# Patient Record
Sex: Male | Born: 1945
Health system: Southern US, Community
[De-identification: ages and names within clinical notes are randomized; demographics above are authoritative.]

## PROBLEM LIST (undated history)

## (undated) DIAGNOSIS — I509 Heart failure, unspecified: Secondary | ICD-10-CM

## (undated) DIAGNOSIS — E7800 Pure hypercholesterolemia, unspecified: Secondary | ICD-10-CM

## (undated) DIAGNOSIS — I10 Essential (primary) hypertension: Secondary | ICD-10-CM

## (undated) DIAGNOSIS — J449 Chronic obstructive pulmonary disease, unspecified: Secondary | ICD-10-CM

## (undated) HISTORY — PX: KNEE ARTHROSCOPY: SHX127

---

## 2004-03-19 ENCOUNTER — Emergency Department (HOSPITAL_COMMUNITY): Admission: EM | Admit: 2004-03-19 | Discharge: 2004-03-20 | Payer: Self-pay | Admitting: Emergency Medicine

## 2004-04-13 ENCOUNTER — Ambulatory Visit (HOSPITAL_COMMUNITY): Admission: RE | Admit: 2004-04-13 | Discharge: 2004-04-13 | Payer: Self-pay | Admitting: Orthopaedic Surgery

## 2004-08-25 ENCOUNTER — Encounter (INDEPENDENT_AMBULATORY_CARE_PROVIDER_SITE_OTHER): Payer: Self-pay | Admitting: Orthopaedic Surgery

## 2004-08-25 ENCOUNTER — Ambulatory Visit (HOSPITAL_COMMUNITY): Admission: RE | Admit: 2004-08-25 | Discharge: 2004-08-25 | Payer: Self-pay | Admitting: Orthopaedic Surgery

## 2008-12-27 ENCOUNTER — Emergency Department (HOSPITAL_COMMUNITY): Admission: EM | Admit: 2008-12-27 | Discharge: 2008-12-27 | Payer: Self-pay | Admitting: Emergency Medicine

## 2010-06-02 LAB — DIFFERENTIAL
Eosinophils Absolute: 0 10*3/uL (ref 0.0–0.7)
Eosinophils Relative: 0 % (ref 0–5)
Lymphocytes Relative: 20 % (ref 12–46)
Lymphs Abs: 1.8 10*3/uL (ref 0.7–4.0)
Monocytes Absolute: 0.9 10*3/uL (ref 0.1–1.0)

## 2010-06-02 LAB — CBC
HCT: 43.1 % (ref 39.0–52.0)
Hemoglobin: 15.3 g/dL (ref 13.0–17.0)
MCV: 99.8 fL (ref 78.0–100.0)
Platelets: 278 10*3/uL (ref 150–400)
RDW: 12.7 % (ref 11.5–15.5)
WBC: 8.8 10*3/uL (ref 4.0–10.5)

## 2010-06-02 LAB — POCT CARDIAC MARKERS: Troponin i, poc: <0.05 ng/mL (ref 0.00–0.09)

## 2010-06-02 LAB — BASIC METABOLIC PANEL
BUN: 8 mg/dL (ref 6–23)
Chloride: 99 mEq/L (ref 96–112)
Glucose, Bld: 81 mg/dL (ref 70–99)
Potassium: 4.6 mEq/L (ref 3.5–5.1)
Sodium: 136 mEq/L (ref 135–145)

## 2010-07-15 NOTE — Op Note (Signed)
Eric Diaz, [name]              ACCOUNT NO.:  [id number]   MEDICAL RECORD NO.:  [id number]          PATIENT TYPE:  AMB   LOCATION:  DAY                           FACILITY:  [facility]   PHYSICIAN:  J. Darreld Mclean, M.D. DATE OF BIRTH:  1945/03/22   DATE OF PROCEDURE:  08/25/2004  DATE OF DISCHARGE:                                 OPERATIVE REPORT   PREOPERATIVE DIAGNOSIS:  Tear of medial meniscus, left  knee.   POSTOPERATIVE DIAGNOSIS:  Tear of medial meniscus, left  knee.   PROCEDURE:  1. Operative arthroscopy, left  knee.  2. Partial medial meniscectomy using the laser.   ANESTHESIA:  General.   TOURNIQUET TIME:  25 minutes.   SURGEON:  J. Darreld Mclean, MD   ASSISTANT:  Candace Cruise, PA-C   INDICATIONS:  The patient is a 65 year old male with pain and tenderness in  his left  knee.  This has been bothering him for several months.  He had  fallen from a roof in January and hurt his left  knee.  MRI done April 13, 2004, showed a tear in the posterior horn and body of the medial  meniscus.  At that time, he did not want any surgery, but he has continued  to have pain and tenderness and a giving away and desires to have surgery  now.  Risks and imponderables were discussed preoperatively.   OPERATIVE FINDINGS:  Suprapatellar pouch looked normal.  Medially, there was  a tear in the posterior horn of the medial meniscus.  The articular surfaces  looked good with just some minimal grade 2 changes.  The anterior cruciate  was intact.  Laterally, the meniscus and the articular surfaces looked  normal.  There were no loose bodies.   DESCRIPTION OF PROCEDURE:  The patient was seen in the holding area and  identified the left  knee as the correct surgical site.  He placed a mark on  it.  I placed a mark over the left patella.  The patient was brought back to  the operating room and given general anesthesia while supine.  The  tourniquet and leg holder were placed beneath to the  left upper thigh.  The  patient was prepped and draped in the usual manner.  We had a timeout, and  we again identified Eric Diaz and that we were doing a left  knee  arthroscopy.  The leg was elevated and wrapped circumferentially with an  Esmarch bandage.  The tourniquet was inflated to 250 mmHg.  The Esmarch  bandage was removed.  The inflow cannula was inserted medially.  The  lactated Ringer's was inserted in the knee by an effusion pump.  The  arthroscope was inserted laterally and the knee systemically examined.  Please see findings above.  Use of the probe from the medial portal showed  the tear in the posterior horn.  Good __________ pictures were taken.  Then  I used the laser and used the laser at a setting of initially 2.4 joules and  increased from 2.6 joules with a 30 pulse, 78 watts.  I  was able to trim the  meniscus and remove the large fragment.  This was removed, and I then  smoothed down the meniscus and the posterior horn.  We got a good smooth  contour.  A total of 28.12 joules were used during the procedure.  A sucker  shaver was used with the ends removed just to suction out any debris.  The  knee was systematically examined and no new pathology found.  The wound was  irrigated with the remaining part of lactated Ringer's.  The wound was  reapproximated using 3-0 nylon in an interrupted vertical mattress manner.  Marcaine 0.25% was instilled into each portal.  The tourniquet was deflated  after 25 minutes.  A sterile dressing was applied.  A bulky dressing was  applied.  The knee immobilizer was applied.  He was sent to recovery in good  condition.   A prescription for Vicodin ES was given for pain.  I will see him in the  office in approximately 10 days.  If he has any difficulty, he can contact  me through the office or hospital beeper system.       JWK/MEDQ  D:  08/25/2004  T:  08/25/2004  Job:  [id number]

## 2010-07-15 NOTE — H&P (Signed)
Eric Diaz, MOZER              ACCOUNT NO.:  [id number]   MEDICAL RECORD NO.:  [id number]          PATIENT TYPE:  AMB   LOCATION:  DAY                           FACILITY:  [facility]   PHYSICIAN:  J. Darreld Mclean, M.D. DATE OF BIRTH:  08-04-1945   DATE OF ADMISSION:  DATE OF DISCHARGE:  LH                                HISTORY & PHYSICAL   CHIEF COMPLAINT:  My knee hurts.   The patient is a 65 year old male with pain and tenderness in his left knee  for several months. He fell from a roof on January 21 and hurt his left  knee. Continued to have pain. Continued to have tenderness in the knee. He  was seen in the office on February 10 for the first time. I thought he had a  medial meniscal injury, and I recommended a MRI. MRI was done on February 15  which showed tear of the posterior horn and body of the medial meniscus and  partial tear of the medial collateral ligament. The patient was advised of  the findings on February 20. He preferred not to have any surgery at that  time. His medial collateral ligament improved, but he continued to have some  pain and tenderness in the knee and some giving way. He wanted to have the  surgery done at the end of June and called the office on June 1 and set this  up. Risks and imponderables of the procedure have been discussed.   PAST MEDICAL HISTORY:  Past history is negative for heart disease, lung  disease, kidney disease, stroke, paralysis, weakness, hypertension,  diabetes, rheumatic fever, cancer, polio, ulcer disease, circulatory  problems.   ALLERGIES:  He has no allergies.   MEDICATIONS:  He has taken Vicodin 7.5 and Naprosyn 500 mg.   SOCIAL HISTORY:  He smokes a pack a day. He uses alcoholic beverages  socially. He is a Engineer, agricultural. He does not have a family doctor. He  lives in [place], and he is married.   PHYSICAL EXAMINATION:  VITAL SIGNS:  His vital signs are within normal  limits.  HEENT:  Negative.  NECK:   Supple.  LUNGS:  Clear to P&A.  HEART:  Regular rhythm without murmur heard.  ABDOMEN:  Soft without masses.  EXTREMITIES:  Left knee has some slight effusion, pain and tenderness  medially. Range of motion of the left knee is full but painful. There is  slight limp to the left with walking and slight atrophy of the left high.  Other extremities within normal limits.  CENTRAL NERVOUS SYSTEM:  Intact.  SKIN:  Intact.   IMPRESSION:  Tear, medial meniscus, left knee.   PLAN:  Arthroscopy of the left knee as an outpatient. We discussed the  procedure, plan, and risk and imponderables. He appears to understand and  agrees to the procedure as outlined. Labs are pending.       JWK/MEDQ  D:  08/23/2004  T:  08/23/2004  Job:  [id number]

## 2010-10-10 ENCOUNTER — Encounter: Payer: Self-pay | Admitting: *Deleted

## 2010-10-10 ENCOUNTER — Emergency Department (HOSPITAL_COMMUNITY)
Admission: EM | Admit: 2010-10-10 | Discharge: 2010-10-10 | Disposition: A | Discharge status: Home / Self Care | Payer: Managed Care, Other (non HMO) | Attending: Emergency Medicine | Admitting: Emergency Medicine

## 2010-10-10 DIAGNOSIS — F17200 Nicotine dependence, unspecified, uncomplicated: Secondary | ICD-10-CM | POA: Insufficient documentation

## 2010-10-10 DIAGNOSIS — R21 Rash and other nonspecific skin eruption: Secondary | ICD-10-CM | POA: Insufficient documentation

## 2010-10-10 MED ORDER — PREDNISONE 10 MG PO TABS: ORAL_TABLET | ORAL | Status: DC

## 2010-10-10 MED ORDER — METHYLPREDNISOLONE SODIUM SUCC 125 MG IJ SOLR
125.0000 mg | Freq: Once | INTRAMUSCULAR | Status: DC
  Filled 2010-10-10: qty 2

## 2010-10-10 MED ORDER — METHYLPREDNISOLONE SODIUM SUCC 125 MG IJ SOLR
125.0000 mg | Freq: Once | INTRAMUSCULAR | Status: AC
  Administered 2010-10-10: 125 mg via INTRAMUSCULAR

## 2010-10-10 MED ORDER — FEXOFENADINE HCL 180 MG PO TABS: 180.0000 mg | ORAL_TABLET | Freq: Every day | ORAL | Status: DC

## 2010-10-10 NOTE — ED Provider Notes (Signed)
History     CSN: [id number] Arrival date & time: 10/10/2010  2:21 PM  Chief Complaint  Patient presents with  . Rash   Patient is a 65 y.o. male presenting with rash. The history is provided by the patient and the spouse.  Rash  This is a new problem. The current episode started more than 2 days ago. The problem has been gradually worsening. Associated with: recent cold/OTC cough and cold meds. The rash is present on the neck, torso, back, abdomen, left hand and right hand. The patient is experiencing no pain. Pertinent negatives include no blisters, no pain and no weeping. He has tried antihistamines for the symptoms.    History reviewed. No pertinent past medical history.  Past Surgical History  Procedure Date  . Knee arthroscopy     left    History reviewed. No pertinent family history.  History  Substance Use Topics  . Smoking status: Current Everyday Smoker -- 1.0 packs/day  . Smokeless tobacco: Not on file  . Alcohol Use: Yes     occasionally      Review of Systems  Constitutional: Negative for activity change.       All ROS Neg except as noted in HPI  HENT: Negative for nosebleeds and neck pain.   Eyes: Negative for photophobia and discharge.  Respiratory: Negative for cough, shortness of breath and wheezing.   Cardiovascular: Negative for chest pain and palpitations.  Gastrointestinal: Negative for abdominal pain and blood in stool.  Genitourinary: Negative for dysuria, frequency and hematuria.  Musculoskeletal: Negative for back pain and arthralgias.  Skin: Positive for rash.  Neurological: Negative for dizziness, seizures and speech difficulty.  Psychiatric/Behavioral: Negative for hallucinations and confusion.    Physical Exam  BP 147/93  Pulse 90  Temp(Src) 97.7 F (36.5 C) (Oral)  Resp 20  Ht 6' (1.829 m)  Wt 175 lb (79.379 kg)  BMI 23.73 kg/m2  SpO2 100%  Physical Exam  Nursing note and vitals reviewed. Constitutional: He is oriented to  person, place, and time. He appears well-developed and well-nourished.  Non-toxic appearance.  HENT:  Head: Normocephalic.  Right Ear: Tympanic membrane and external ear normal.  Left Ear: Tympanic membrane and external ear normal.       Airway patent.  Eyes: EOM and lids are normal. Pupils are equal, round, and reactive to light.  Neck: Normal range of motion. Neck supple. Carotid bruit is not present.       Trachea midline.  Cardiovascular: Normal rate, regular rhythm, normal heart sounds, intact distal pulses and normal pulses.   Pulmonary/Chest: Breath sounds normal. No respiratory distress.       No wheezes, rales or congestion.  Abdominal: Soft. Bowel sounds are normal. There is no tenderness. There is no guarding.  Musculoskeletal: Normal range of motion.  Lymphadenopathy:       Head (right side): No submandibular adenopathy present.       Head (left side): No submandibular adenopathy present.    He has no cervical adenopathy.  Neurological: He is alert and oriented to person, place, and time. He has normal strength. No cranial nerve deficit or sensory deficit.  Skin: Skin is warm and dry. Rash noted.       Raised papular rash on the abd, back, neck, chest and both arms. No blister. No hives. No mouth involvement.  Psychiatric: He has a normal mood and affect. His speech is normal.    ED Course  Procedures  MDM I have reviewed  nursing notes, vital signs, and all appropriate lab and imaging results for this patient. Suspect OTC drug reaction vs VIRal exanthem.      Kathie Dike, PA 10/10/10 1516  Kathie Dike, PA 10/10/10 [phone]

## 2010-10-10 NOTE — ED Notes (Signed)
Rash all over body since Thursday. Pt has had a cold and cough and has been taking OTC meds. Pt states that he is coughing up yellow phlegm. Denies fever.

## 2010-10-10 NOTE — ED Notes (Signed)
Rash over arms, back and torso

## 2010-11-10 NOTE — ED Provider Notes (Signed)
Medical screening examination/treatment/procedure(s) were performed by non-physician practitioner and as supervising physician I was immediately available for consultation/collaboration.  Lailah Marcelli P Vinh Sachs, MD 11/10/10 1730 

## 2018-03-05 ENCOUNTER — Encounter (HOSPITAL_COMMUNITY): Payer: Self-pay | Admitting: Emergency Medicine

## 2018-03-05 ENCOUNTER — Inpatient Hospital Stay (HOSPITAL_COMMUNITY)
Admission: EM | Admit: 2018-03-05 | Discharge: 2018-03-07 | DRG: 242 | Disposition: A | Discharge status: Home / Self Care | Payer: Medicare HMO | Attending: Family Medicine | Admitting: Family Medicine

## 2018-03-05 ENCOUNTER — Other Ambulatory Visit: Payer: Self-pay

## 2018-03-05 ENCOUNTER — Emergency Department (HOSPITAL_COMMUNITY): Payer: Medicare HMO

## 2018-03-05 DIAGNOSIS — I442 Atrioventricular block, complete: Secondary | ICD-10-CM | POA: Diagnosis present

## 2018-03-05 DIAGNOSIS — I509 Heart failure, unspecified: Secondary | ICD-10-CM | POA: Diagnosis present

## 2018-03-05 DIAGNOSIS — R0602 Shortness of breath: Secondary | ICD-10-CM

## 2018-03-05 DIAGNOSIS — I452 Bifascicular block: Secondary | ICD-10-CM | POA: Diagnosis present

## 2018-03-05 DIAGNOSIS — J189 Pneumonia, unspecified organism: Secondary | ICD-10-CM | POA: Diagnosis present

## 2018-03-05 DIAGNOSIS — Z91030 Bee allergy status: Secondary | ICD-10-CM | POA: Diagnosis not present

## 2018-03-05 DIAGNOSIS — I459 Conduction disorder, unspecified: Secondary | ICD-10-CM | POA: Diagnosis present

## 2018-03-05 DIAGNOSIS — I110 Hypertensive heart disease with heart failure: Secondary | ICD-10-CM | POA: Diagnosis present

## 2018-03-05 DIAGNOSIS — Z801 Family history of malignant neoplasm of trachea, bronchus and lung: Secondary | ICD-10-CM | POA: Diagnosis not present

## 2018-03-05 DIAGNOSIS — Z959 Presence of cardiac and vascular implant and graft, unspecified: Secondary | ICD-10-CM

## 2018-03-05 DIAGNOSIS — I160 Hypertensive urgency: Secondary | ICD-10-CM | POA: Diagnosis present

## 2018-03-05 DIAGNOSIS — F17210 Nicotine dependence, cigarettes, uncomplicated: Secondary | ICD-10-CM | POA: Diagnosis present

## 2018-03-05 DIAGNOSIS — R55 Syncope and collapse: Secondary | ICD-10-CM | POA: Diagnosis present

## 2018-03-05 LAB — CBC
HCT: 40.9 % (ref 39.0–52.0)
Hemoglobin: 13.7 g/dL (ref 13.0–17.0)
MCH: 34.3 pg — ABNORMAL HIGH (ref 26.0–34.0)
MCHC: 33.5 g/dL (ref 30.0–36.0)
MCV: 102.5 fL — ABNORMAL HIGH (ref 80.0–100.0)
Platelets: 259 10*3/uL (ref 150–400)
RBC: 3.99 MIL/uL — ABNORMAL LOW (ref 4.22–5.81)
RDW: 12.6 % (ref 11.5–15.5)
WBC: 10.1 10*3/uL (ref 4.0–10.5)
nRBC: 0 % (ref 0.0–0.2)

## 2018-03-05 LAB — I-STAT TROPONIN, ED: Troponin i, poc: 0.02 ng/mL (ref 0.00–0.08)

## 2018-03-05 LAB — I-STAT CG4 LACTIC ACID, ED: Lactic Acid, Venous: 1.7 mmol/L (ref 0.5–1.9)

## 2018-03-05 LAB — BASIC METABOLIC PANEL
Anion gap: 9 (ref 5–15)
BUN: 12 mg/dL (ref 8–23)
CO2: 21 mmol/L — ABNORMAL LOW (ref 22–32)
Calcium: 8.8 mg/dL — ABNORMAL LOW (ref 8.9–10.3)
Chloride: 101 mmol/L (ref 98–111)
Creatinine, Ser: 0.89 mg/dL (ref 0.61–1.24)
GFR calc Af Amer: >60 mL/min (ref >60)
GFR calc non Af Amer: >60 mL/min (ref >60)
GLUCOSE: 122 mg/dL — AB (ref 70–99)
Potassium: 3.9 mmol/L (ref 3.5–5.1)
Sodium: 131 mmol/L — ABNORMAL LOW (ref 135–145)

## 2018-03-05 LAB — BRAIN NATRIURETIC PEPTIDE: B Natriuretic Peptide: 1341 pg/mL — ABNORMAL HIGH (ref 0.0–100.0)

## 2018-03-05 LAB — TSH: TSH: 3.114 u[IU]/mL (ref 0.350–4.500)

## 2018-03-05 LAB — MAGNESIUM: Magnesium: 2 mg/dL (ref 1.7–2.4)

## 2018-03-05 MED ORDER — VITAMIN B-12 100 MCG PO TABS
100.0000 ug | ORAL_TABLET | Freq: Every day | ORAL | Status: DC
  Administered 2018-03-06 – 2018-03-07 (×2): 100 ug via ORAL
  Filled 2018-03-05 (×2): qty 1

## 2018-03-05 MED ORDER — ALBUTEROL SULFATE (2.5 MG/3ML) 0.083% IN NEBU
5.0000 mg | INHALATION_SOLUTION | Freq: Once | RESPIRATORY_TRACT | Status: AC
  Administered 2018-03-05: 5 mg via RESPIRATORY_TRACT
  Filled 2018-03-05: qty 6

## 2018-03-05 MED ORDER — ORAL CARE MOUTH RINSE
15.0000 mL | Freq: Two times a day (BID) | OROMUCOSAL | Status: DC
  Administered 2018-03-06 – 2018-03-07 (×2): 15 mL via OROMUCOSAL

## 2018-03-05 MED ORDER — HYDRALAZINE HCL 20 MG/ML IJ SOLN
5.0000 mg | Freq: Once | INTRAMUSCULAR | Status: AC
  Administered 2018-03-05: 5 mg via INTRAVENOUS
  Filled 2018-03-05: qty 1

## 2018-03-05 MED ORDER — ACETAMINOPHEN 325 MG PO TABS: 650.0000 mg | ORAL_TABLET | ORAL | Status: DC | PRN

## 2018-03-05 MED ORDER — ONDANSETRON HCL 4 MG/2ML IJ SOLN: 4.0000 mg | Freq: Four times a day (QID) | INTRAMUSCULAR | Status: DC | PRN

## 2018-03-05 MED ORDER — PNEUMOCOCCAL VAC POLYVALENT 25 MCG/0.5ML IJ INJ: 0.5000 mL | INJECTION | INTRAMUSCULAR | Status: DC

## 2018-03-05 MED ORDER — ADULT MULTIVITAMIN W/MINERALS CH
1.0000 | ORAL_TABLET | Freq: Every day | ORAL | Status: DC
  Administered 2018-03-06 – 2018-03-07 (×2): 1 via ORAL
  Filled 2018-03-05 (×2): qty 1

## 2018-03-05 MED ORDER — FUROSEMIDE 10 MG/ML IJ SOLN
40.0000 mg | Freq: Once | INTRAMUSCULAR | Status: AC
  Administered 2018-03-06: 40 mg via INTRAVENOUS
  Filled 2018-03-05: qty 4

## 2018-03-05 NOTE — ED Notes (Signed)
Cardia pads placed.

## 2018-03-05 NOTE — ED Notes (Signed)
ED Provider at bedside. 

## 2018-03-05 NOTE — ED Notes (Signed)
ED TO INPATIENT HANDOFF REPORT  Name/Age/Gender Eric ChangEdmond T Diaz 73 y.o. male  Code Status   Home/SNF/Other Home  Chief Complaint SOB  Level of Care/Admitting Diagnosis ED Disposition    ED Disposition Condition Comment   Admit  [facility] [[id number]]  Level of Care: Progressive [102]  Diagnosis: Heart block [147829][[id number]]  Admitting Physician: Antoine PocheBRANCH, JONATHAN F [[id number]]  Attending Physician: Antoine PocheBRANCH, JONATHAN F [[id number]]  Estimated length of stay: past midnight tomorrow  Certification:: I certify this patient will need inpatient services for at least 2 midnights  PT Class (Do Not Modify): Inpatient [101]  PT Acc Code (Do Not Modify): Private [1]       Medical History History reviewed. No pertinent past medical history.  Allergies Allergies  Allergen Reactions  . Bee Venom Anaphylaxis and Swelling    IV Location/Drains/Wounds Patient Lines/Drains/Airways Status   Active Line/Drains/Airways    Name:   Placement date:   Placement time:   Site:   Days:   Peripheral IV 03/05/18 Right Antecubital   03/05/18    1729    Antecubital   less than 1          Labs/Imaging Results for orders placed or performed during the hospital encounter of 03/05/18 (from the past 48 hour(s))  CBC     Status: Abnormal   Collection Time: 03/05/18  5:33 PM  Result Value Ref Range   WBC 10.1 4.0 - 10.5 K/uL   RBC 3.99 (L) 4.22 - 5.81 MIL/uL   Hemoglobin 13.7 13.0 - 17.0 g/dL   HCT 62.940.9 52.839.0 - 41.352.0 %   MCV 102.5 (H) 80.0 - 100.0 fL   MCH 34.3 (H) 26.0 - 34.0 pg   MCHC 33.5 30.0 - 36.0 g/dL   RDW 24.412.6 01.011.5 - 27.215.5 %   Platelets 259 150 - 400 K/uL   nRBC 0.0 0.0 - 0.2 %    Comment: Performed at [facility], [address] [place], [place] [address]  Basic metabolic panel     Status: Abnormal   Collection Time: 03/05/18  5:33 PM  Result Value Ref Range   Sodium 131 (L) 135 - 145 mmol/L   Potassium 3.9 3.5 - 5.1 mmol/L   Chloride 101 98 - 111 mmol/L   CO2 21 (L) 22 - 32 mmol/L   Glucose, Bld 122 (H) 70 - 99 mg/dL   BUN 12 8 - 23 mg/dL   Creatinine, Ser 4.030.89 0.61 - 1.24 mg/dL   Calcium 8.8 (L) 8.9 - 10.3 mg/dL   GFR calc non Af Amer >60 >60 mL/min   GFR calc Af Amer >60 >60 mL/min   Anion gap 9 5 - 15    Comment: Performed at [facility], [address] [place], [place] [address]  Brain natriuretic peptide     Status: Abnormal   Collection Time: 03/05/18  5:33 PM  Result Value Ref Range   B Natriuretic Peptide 1,341.0 (H) 0.0 - 100.0 pg/mL    Comment: Performed at [facility], [address] [place], [place] [address]  TSH     Status: None   Collection Time: 03/05/18  5:33 PM  Result Value Ref Range   TSH 3.114 0.350 - 4.500 uIU/mL    Comment: Performed by a 3rd Generation assay with a functional sensitivity of <=0.01 uIU/mL. Performed at [facility], [address] [place], [place] [address]   Magnesium     Status: None   Collection Time: 03/05/18  5:33 PM  Result Value Ref Range   Magnesium 2.0 1.7 - 2.4 mg/dL    Comment: Performed at [facility], [address], [place], [place] [address]  I-stat troponin, ED     Status: None   Collection Time: 03/05/18  5:42 PM  Result Value Ref Range   Troponin i, poc 0.02 0.00 - 0.08 ng/mL   Comment 3            Comment: Due to the release kinetics of cTnI, a negative result within the first hours of the onset of symptoms does not rule out myocardial infarction with certainty. If myocardial infarction is still suspected, repeat the test at appropriate intervals.   I-Stat CG4 Lactic Acid, ED     Status: None   Collection Time: 03/05/18  5:45 PM  Result Value Ref Range   Lactic Acid, Venous 1.70 0.5 - 1.9 mmol/L   Dg Chest Port 1 View  Result Date: 03/05/2018 CLINICAL DATA:  73 y/o M; shortness of breath that began 1 week ago with cough. Patient now has generalized weakness and dizziness. EXAM: PORTABLE CHEST 1 VIEW COMPARISON:  None. FINDINGS: Normal cardiac silhouette given  projection and technique. Transcutaneous pacing pads noted. Reticular opacities of the lungs. No consolidation or effusion. Pulmonary hyperinflation and emphysema. No acute osseous abnormality is evident. IMPRESSION: Reticular opacities of the lungs may represent interstitial edema or atypical pneumonia. COPD. Electronically Signed   By: Mitzi Hansen M.D.   On: 03/05/2018 18:07    Pending Labs Unresulted Labs (From admission, onward)   None      Vitals/Pain Today's Vitals   03/05/18 2000 03/05/18 2015 03/05/18 2030 03/05/18 2045  BP: (!) 173/52 (!) 193/62 (!) 203/47 (!) 185/48  Pulse: (!) 44 (!) 35 (!) 45 (!) 42  Resp: 20     Temp:      TempSrc:      SpO2: 98% 94% 97% 98%  Weight:      Height:      PainSc:        Isolation Precautions No active isolations  Medications Medications  albuterol (PROVENTIL) (2.5 MG/3ML) 0.083% nebulizer solution 5 mg (5 mg Nebulization Given 03/05/18 1800)  hydrALAZINE (APRESOLINE) injection 5 mg (5 mg Intravenous Given 03/05/18 1756)    Mobility walks

## 2018-03-05 NOTE — H&P (Signed)
.  Cardiology Admission History and Physical:   Patient ID: Eric Diaz MRN: [id number]; DOB: Oct 12, 1945   Admission date: 03/05/2018  Primary Care Provider: Phylliss Bob, MD Primary Cardiologist: No primary care provider on file. Primary Electrophysiologist:  None   Chief Complaint:  SOB    Patient Profile:   Eric Diaz is a 73 y.o. male with essential hypertension, tobacco abuse, and syncopal episodes without primary care follow-up.  History of Present Illness:   Eric Diaz is a pleasant gentleman accompanied by his wife who presents for 1 week of worsening dyspnea on exertion.  After talking with his wife states that patient is actually been noted to have a slow heart rate in the 30s and 40s over the past month with decreased functional capacity.  This significantly worsened in the past week with minimal exertion and inability to lay flat at night and a dry cough.  Shortness of breath was severe in severity and worsened with exertion.  He was being evaluated by cardiologist who then sent him for an echo some weeks ago and was and got an echo earlier today report results are not known at this time.  Due to the persistent shortness of breath symptoms the presented to the ER where he was found that the [facility] ED to have heart rate in the 30s with complete heart block and was accepted as a transfer per Dina Rich for likely pacemaker.  Blood pressure was significantly elevated in the ER as high as 200 systolic and is improved with medical therapy into the 1 60-1 70s.   History reviewed. No pertinent past medical history.  Past Surgical History:  Procedure Laterality Date  . KNEE ARTHROSCOPY     left     Medications Prior to Admission: Prior to Admission medications   Medication Sig Start Date End Date Taking? Authorizing Provider  acetaminophen (TYLENOL) 500 MG tablet Take 1,000 mg by mouth every 6 (six) hours as needed for mild pain or moderate pain.   Yes  [provider]  Cyanocobalamin (B-12 PO) Take 1 tablet by mouth daily.   Yes [provider]  ibuprofen (ADVIL,MOTRIN) 200 MG tablet Take 400 mg by mouth every 6 (six) hours as needed for mild pain or moderate pain.   Yes [provider]  Multiple Vitamin (MULTIVITAMIN WITH MINERALS) TABS tablet Take 1 tablet by mouth daily.   Yes [provider]     Allergies:    Allergies  Allergen Reactions  . Bee Venom Anaphylaxis and Swelling    Social History:   Social History   Socioeconomic History  . Marital status: Married    Spouse name: Not on file  . Number of children: Not on file  . Years of education: Not on file  . Highest education level: Not on file  Occupational History  . Not on file  Social Needs  . Financial resource strain: Not on file  . Food insecurity:    Worry: Not on file    Inability: Not on file  . Transportation needs:    Medical: Not on file    Non-medical: Not on file  Tobacco Use  . Smoking status: Current Every Day Smoker    Packs/day: 1.00  . Smokeless tobacco: Never Used  Substance and Sexual Activity  . Alcohol use: Yes    Comment: occasionally  . Drug use: No  . Sexual activity: Not on file  Lifestyle  . Physical activity:    Days per  week: Not on file    Minutes per session: Not on file  . Stress: Not on file  Relationships  . Social connections:    Talks on phone: Not on file    Gets together: Not on file    Attends religious service: Not on file    Active member of club or organization: Not on file    Attends meetings of clubs or organizations: Not on file    Relationship status: Not on file  . Intimate partner violence:    Fear of current or ex partner: Not on file    Emotionally abused: Not on file    Physically abused: Not on file    Forced sexual activity: Not on file  Other Topics Concern  . Not on file  Social History Narrative  . Not on file    Family History:  No family hx of cad The  patient's family history is not on file.     Review of Systems: [y] = yes, [ ]  = no   . General: Weight gain [ ] ; Weight loss [ ] ; Anorexia [ ] ; Fatigue [ ] ; Fever [ ] ; Chills [ ] ; Weakness [ ]   . Cardiac: Chest pain/pressure [ ] ; Resting SOB [ ] ; Exertional SOB [ y]; Orthopnea [ y]; Pedal Edema [ ] ; Palpitations [ ] ; Syncope [ ] ; Presyncope [ ] ; Paroxysmal nocturnal dyspnea[ ]   . Pulmonary: Cough [place][y ]; Wheezing[ ] ; Hemoptysis[ ] ; Sputum [ ] ; Snoring [ ]   . GI: Vomiting[ ] ; Dysphagia[ ] ; Melena[ ] ; Hematochezia [ ] ; Heartburn[ ] ; Abdominal pain [ ] ; Constipation [ ] ; Diarrhea [ ] ; BRBPR [ ]   . GU: Hematuria[ ] ; Dysuria [ ] ; Nocturia[ ]   . Vascular: Pain in legs with walking [ ] ; Pain in feet with lying flat [ ] ; Non-healing sores [ ] ; Stroke [ ] ; TIA [ ] ; Slurred speech [ ] ;  . Neuro: Headaches[ ] ; Vertigo[ ] ; Seizures[ ] ; Paresthesias[ ] ;Blurred vision [ ] ; Diplopia [ ] ; Vision changes [ ]   . Ortho/Skin: Arthritis [ ] ; Joint pain [ ] ; Muscle pain [ ] ; Joint swelling [ ] ; Back Pain [ ] ; Rash [ ]   . Psych: Depression[ ] ; Anxiety[ ]   . Heme: Bleeding problems [ ] ; Clotting disorders [ ] ; Anemia [ ]   . Endocrine: Diabetes [ ] ; Thyroid dysfunction[ ]   Physical Exam/Data:   Vitals:   03/05/18 2130 03/05/18 2157 03/05/18 2300 03/05/18 2311  BP: (!) 190/55 (!) 175/59  (!) 188/51  Pulse: (!) 46 (!) 35  (!) 34  Resp: 19 (!) 22  19  Temp:    97.8 F (36.6 C)  TempSrc:    Oral  SpO2: 96% 96%  96%  Weight:   73.5 kg   Height:   6' (1.829 m)    No intake or output data in the 24 hours ending 03/05/18 2321 Filed Weights   03/05/18 1711 03/05/18 2300  Weight: 73.5 kg 73.5 kg   Body mass index is 21.98 kg/m.  General:  Well nourished, well developed, in no acute distress HEENT: normal Lymph: no adenopathy Neck: elevated JVD Endocrine:  No thryomegaly Vascular: No carotid bruits; FA pulses 2+ bilaterally without bruits  Cardiac:  normal S1, S2; RRR; no murmur  Lungs:  clear to  auscultation bilaterally, no wheezing, rhonchi or rales  Abd: soft, nontender, no hepatomegaly  Ext: no edema Musculoskeletal:  No deformities, BUE and BLE strength normal and equal Skin: warm and dry  Neuro:  CNs 2-12 intact, no  focal abnormalities noted Psych:  Normal affect    EKG:  The ECG that was done 1/7 was personally reviewed and demonstrates 3rd degree heart block  Relevant CV Studies: Bnp 1340  Laboratory Data:  Chemistry Recent Labs  Lab 03/05/18 1733  NA 131*  K 3.9  CL 101  CO2 21*  GLUCOSE 122*  BUN 12  CREATININE 0.89  CALCIUM 8.8*  GFRNONAA >60  GFRAA >60  ANIONGAP 9    No results for input(s): PROT, ALBUMIN, AST, ALT, ALKPHOS, BILITOT in the last 168 hours. Hematology Recent Labs  Lab 03/05/18 1733  WBC 10.1  RBC 3.99*  HGB 13.7  HCT 40.9  MCV 102.5*  MCH 34.3*  MCHC 33.5  RDW 12.6  PLT 259   Cardiac EnzymesNo results for input(s): TROPONINI in the last 168 hours.  Recent Labs  Lab 03/05/18 1742  TROPIPOC 0.02    BNP Recent Labs  Lab 03/05/18 1733  BNP 1,341.0*    DDimer No results for input(s): DDIMER in the last 168 hours.  Radiology/Studies:  Dg Chest Port 1 View  Result Date: 03/05/2018 CLINICAL DATA:  73 y/o M; shortness of breath that began 1 week ago with cough. Patient now has generalized weakness and dizziness. EXAM: PORTABLE CHEST 1 VIEW COMPARISON:  None. FINDINGS: Normal cardiac silhouette given projection and technique. Transcutaneous pacing pads noted. Reticular opacities of the lungs. No consolidation or effusion. Pulmonary hyperinflation and emphysema. No acute osseous abnormality is evident. IMPRESSION: Reticular opacities of the lungs may represent interstitial edema or atypical pneumonia. COPD. Electronically Signed   By: Mitzi Hansen M.D.   On: 03/05/2018 18:07    Assessment and Plan:   1. Third-degree AV block with junctional escape 2. Acute heart failure with unknown EF 3. RBBB and  LPFB 4. Hypertensive urgency 5. History of Syncope  -Plan for ppm 1/8 -NPO after midnight -hold lovenox or heparin -IV lasix 40 mg this am   Severity of Illness: The appropriate patient status for this patient is INPATIENT. Inpatient status is judged to be reasonable and necessary in order to provide the required intensity of service to ensure the patient's safety. The patient's presenting symptoms, physical exam findings, and initial radiographic and laboratory data in the context of their chronic comorbidities is felt to place them at high risk for further clinical deterioration. Furthermore, it is not anticipated that the patient will be medically stable for discharge from the hospital within 2 midnights of admission. The following factors support the patient status of inpatient.   " The patient's presenting symptoms include heart failure symptoms and significant shortness of breath with exertion. " The worrisome physical exam findings include elevated JVP. " The initial radiographic and laboratory data are worrisome because of EKG demonstrates complete heart block and patient has hypertensive urgency. " The chronic co-morbidities include essential hypertension, tobacco abuse.   * I certify that at the point of admission it is my clinical judgment that the patient will require inpatient hospital care spanning beyond 2 midnights from the point of admission due to high intensity of service, high risk for further deterioration and high frequency of surveillance required.*    For questions or updates, please contact [facility] Please consult [email or web address] for contact info under        Signed, Macie Burows, MD  03/05/2018 11:21 PM

## 2018-03-05 NOTE — ED Provider Notes (Signed)
[facility] Emergency Department Provider Note MRN:  [id number]  Arrival date & time: 03/05/18     Chief Complaint   Shortness of Breath   History of Present Illness   Eric Diaz is a 73 y.o. year-old male with unknown past medical history presenting to the ED with chief complaint of shortness of breath.  1 week of persistent, worsening shortness of breath.  Patient explains that the shortness of breath is much worse when he is trying to lay flat at night.  Mild dry cough.  Denies other cold-like symptoms.  Also endorsing more recent lightheadedness, dull frontal headache, denies chest pain, no abdominal pain, no numbness or weakness to the arms or legs.  Review of Systems  A complete 10 system review of systems was obtained and all systems are negative except as noted in the HPI and PMH.   Patient's Health History   History reviewed. No pertinent past medical history.  Past Surgical History:  Procedure Laterality Date  . KNEE ARTHROSCOPY     left    No family history on file.  Social History   Socioeconomic History  . Marital status: Married    Spouse name: Not on file  . Number of children: Not on file  . Years of education: Not on file  . Highest education level: Not on file  Occupational History  . Not on file  Social Needs  . Financial resource strain: Not on file  . Food insecurity:    Worry: Not on file    Inability: Not on file  . Transportation needs:    Medical: Not on file    Non-medical: Not on file  Tobacco Use  . Smoking status: Current Every Day Smoker    Packs/day: 1.00  . Smokeless tobacco: Never Used  Substance and Sexual Activity  . Alcohol use: Yes    Comment: occasionally  . Drug use: No  . Sexual activity: Not on file  Lifestyle  . Physical activity:    Days per week: Not on file    Minutes per session: Not on file  . Stress: Not on file  Relationships  . Social connections:    Talks on phone: Not on file   Gets together: Not on file    Attends religious service: Not on file    Active member of club or organization: Not on file    Attends meetings of clubs or organizations: Not on file    Relationship status: Not on file  . Intimate partner violence:    Fear of current or ex partner: Not on file    Emotionally abused: Not on file    Physically abused: Not on file    Forced sexual activity: Not on file  Other Topics Concern  . Not on file  Social History Narrative  . Not on file     Physical Exam  Vital Signs and Nursing Notes reviewed Vitals:   03/05/18 2130 03/05/18 2157  BP: (!) 190/55 (!) 175/59  Pulse: (!) 46 (!) 35  Resp: 19 (!) 22  Temp:    SpO2: 96% 96%    CONSTITUTIONAL: Well-appearing, NAD NEURO:  Alert and oriented x 3, normal and symmetric strength and sensation, no visual field cuts, no aphasia, no neglect EYES:  eyes equal and reactive, normal extraocular movements ENT/NECK:  no LAD, no JVD CARDIO: Bradycardic rate, well-perfused, normal S1 and S2 PULM:  CTAB no wheezing or rhonchi GI/GU:  normal bowel sounds, non-distended, non-tender MSK/SPINE:  No gross deformities, no edema SKIN:  no rash, atraumatic PSYCH:  Appropriate speech and behavior  Diagnostic and Interventional Summary    EKG Interpretation  Date/Time:  Tuesday March 05 2018 17:20:41 EST Ventricular Rate:  33 PR Interval:    QRS Duration: 144 QT Interval:  564 QTC Calculation: 418 R Axis:   98 Text Interpretation:  Complete heart block Prolonged PR interval LAE, consider biatrial enlargement RBBB and LPFB ST depr, consider ischemia, inferior leads Confirmed by Kennis CarinaBero, Guillermo Difrancesco [id number]) on 03/05/2018 5:51:04 PM      Labs Reviewed  CBC - Abnormal; Notable for the following components:      Result Value   RBC 3.99 (*)    MCV 102.5 (*)    MCH 34.3 (*)    All other components within normal limits  BASIC METABOLIC PANEL - Abnormal; Notable for the following components:   Sodium 131 (*)    CO2  21 (*)    Glucose, Bld 122 (*)    Calcium 8.8 (*)    All other components within normal limits  BRAIN NATRIURETIC PEPTIDE - Abnormal; Notable for the following components:   B Natriuretic Peptide 1,341.0 (*)    All other components within normal limits  TSH  MAGNESIUM  I-STAT TROPONIN, ED  I-STAT CG4 LACTIC ACID, ED    DG Chest Port 1 View  Final Result      Medications  albuterol (PROVENTIL) (2.5 MG/3ML) 0.083% nebulizer solution 5 mg (5 mg Nebulization Given 03/05/18 1800)  hydrALAZINE (APRESOLINE) injection 5 mg (5 mg Intravenous Given 03/05/18 1756)     Procedures Critical Care Critical Care Documentation Critical care time provided by me (excluding procedures): 45 minutes  Condition necessitating critical care: Complete heart block  Components of critical care management: reviewing of prior records, laboratory and imaging interpretation, frequent re-examination and reassessment of vital signs, administration of IV hydralazine, discussion with consulting services, facilitation of transfer.   ED Course and Medical Decision Making  I have reviewed the triage vital signs and the nursing notes.  Pertinent labs & imaging results that were available during my care of the patient were reviewed by me and considered in my medical decision making (see below for details).  Complete heart block in this 73 year old male who has not seen a doctor in 10 to 20 years.  Progressive shortness of breath, orthopnea.  Patient is well-perfused, strong peripheral pulses, alert and oriented.  Heart rate in the low 30s, unclear as to why patient's blood pressure is elevated in the 200s systolic.  Very mild headache with no neurological deficits, normal extraocular movements, low concern for increased ICP or CNS pathology.  Will discuss with cardiology and [facility]  Pacer pads in place, monitored closely.  Discussed with Dr. Wyline MoodBranch of cardiology, who is accepted the patient for  ad[facility].   Elmer SowMichael M. Pilar PlateBero, MD [facility] [email or web address]  Final Clinical Impressions(s) / ED Diagnoses     ICD-10-CM   1. Complete heart block (HCC) I44.2   2. SOB (shortness of breath) R06.02 [facility]    [facility]    ED Discharge Orders    None         Sabas SousBero, Brei Pociask M, MD 03/05/18 2258

## 2018-03-05 NOTE — ED Triage Notes (Signed)
Patient complains of shortness of breath that started a week ago. Patient states that his shortness of breath has progressed in that time and now he becomes very winded when he walk more than 10 feet. States he has new onset of generalized weakness and dizziness.

## 2018-03-06 ENCOUNTER — Inpatient Hospital Stay (HOSPITAL_COMMUNITY)
Admission: EM | Disposition: A | Discharge status: Home / Self Care | Payer: Self-pay | Source: Home / Self Care | Attending: Family Medicine

## 2018-03-06 ENCOUNTER — Inpatient Hospital Stay (HOSPITAL_COMMUNITY): Payer: Medicare HMO

## 2018-03-06 DIAGNOSIS — R0602 Shortness of breath: Secondary | ICD-10-CM

## 2018-03-06 DIAGNOSIS — I509 Heart failure, unspecified: Secondary | ICD-10-CM

## 2018-03-06 DIAGNOSIS — I442 Atrioventricular block, complete: Principal | ICD-10-CM

## 2018-03-06 HISTORY — PX: PACEMAKER IMPLANT: EP1218

## 2018-03-06 LAB — SURGICAL PCR SCREEN
MRSA, PCR: POSITIVE — AB
Staphylococcus aureus: POSITIVE — AB

## 2018-03-06 LAB — ECHOCARDIOGRAM COMPLETE
Height: 72 in
Weight: 2592.61 oz

## 2018-03-06 SURGERY — PACEMAKER IMPLANT

## 2018-03-06 MED ORDER — FENTANYL CITRATE (PF) 100 MCG/2ML IJ SOLN
INTRAMUSCULAR | Status: AC
  Filled 2018-03-06: qty 2

## 2018-03-06 MED ORDER — SODIUM CHLORIDE 0.9 % IV SOLN
80.0000 mg | INTRAVENOUS | Status: AC
  Administered 2018-03-06: 80 mg
  Filled 2018-03-06: qty 2

## 2018-03-06 MED ORDER — MIDAZOLAM HCL 5 MG/5ML IJ SOLN
INTRAMUSCULAR | Status: DC | PRN
  Administered 2018-03-06: 1 mg via INTRAVENOUS

## 2018-03-06 MED ORDER — CHLORHEXIDINE GLUCONATE 4 % EX LIQD: 60.0000 mL | Freq: Once | CUTANEOUS | Status: DC

## 2018-03-06 MED ORDER — FENTANYL CITRATE (PF) 100 MCG/2ML IJ SOLN
INTRAMUSCULAR | Status: DC | PRN
  Administered 2018-03-06: 25 ug via INTRAVENOUS

## 2018-03-06 MED ORDER — MUPIROCIN 2 % EX OINT
1.0000 "application " | TOPICAL_OINTMENT | Freq: Two times a day (BID) | CUTANEOUS | Status: DC
  Administered 2018-03-06 – 2018-03-07 (×3): 1 via NASAL
  Filled 2018-03-06: qty 22

## 2018-03-06 MED ORDER — HEPARIN (PORCINE) IN NACL 1000-0.9 UT/500ML-% IV SOLN
INTRAVENOUS | Status: AC
  Filled 2018-03-06: qty 500

## 2018-03-06 MED ORDER — SODIUM CHLORIDE 0.9 % IV SOLN: INTRAVENOUS | Status: AC

## 2018-03-06 MED ORDER — MIDAZOLAM HCL 5 MG/5ML IJ SOLN
INTRAMUSCULAR | Status: AC
  Filled 2018-03-06: qty 5

## 2018-03-06 MED ORDER — PERFLUTREN LIPID MICROSPHERE
INTRAVENOUS | Status: AC
  Filled 2018-03-06: qty 10

## 2018-03-06 MED ORDER — SODIUM CHLORIDE 0.9 % IV SOLN
INTRAVENOUS | Status: AC
  Filled 2018-03-06: qty 2

## 2018-03-06 MED ORDER — PERFLUTREN LIPID MICROSPHERE
1.0000 mL | INTRAVENOUS | Status: AC | PRN
  Administered 2018-03-06: 2 mL via INTRAVENOUS
  Filled 2018-03-06: qty 10

## 2018-03-06 MED ORDER — SODIUM CHLORIDE 0.9 % IV SOLN: INTRAVENOUS | Status: DC

## 2018-03-06 MED ORDER — CEFAZOLIN SODIUM-DEXTROSE 2-4 GM/100ML-% IV SOLN
INTRAVENOUS | Status: AC
  Filled 2018-03-06: qty 100

## 2018-03-06 MED ORDER — HEPARIN (PORCINE) IN NACL 2-0.9 UNITS/ML
INTRAMUSCULAR | Status: AC | PRN
  Administered 2018-03-06: 500 mL

## 2018-03-06 MED ORDER — SODIUM CHLORIDE 0.9 % IV SOLN: INTRAVENOUS | Status: DC | PRN

## 2018-03-06 MED ORDER — CEFAZOLIN SODIUM-DEXTROSE 1-4 GM/50ML-% IV SOLN
1.0000 g | Freq: Four times a day (QID) | INTRAVENOUS | Status: AC
  Administered 2018-03-06 – 2018-03-07 (×3): 1 g via INTRAVENOUS
  Filled 2018-03-06 (×3): qty 50

## 2018-03-06 MED ORDER — CHLORHEXIDINE GLUCONATE CLOTH 2 % EX PADS
6.0000 | MEDICATED_PAD | Freq: Every day | CUTANEOUS | Status: DC
  Administered 2018-03-06 – 2018-03-07 (×2): 6 via TOPICAL

## 2018-03-06 MED ORDER — LIDOCAINE HCL (PF) 1 % IJ SOLN
INTRAMUSCULAR | Status: DC | PRN
  Administered 2018-03-06: 60 mL

## 2018-03-06 MED ORDER — LIDOCAINE HCL 1 % IJ SOLN
INTRAMUSCULAR | Status: AC
  Filled 2018-03-06: qty 60

## 2018-03-06 MED ORDER — CEFAZOLIN SODIUM-DEXTROSE 2-4 GM/100ML-% IV SOLN
2.0000 g | INTRAVENOUS | Status: AC
  Administered 2018-03-06: 2 g via INTRAVENOUS
  Filled 2018-03-06: qty 100

## 2018-03-06 MED ORDER — ONDANSETRON HCL 4 MG/2ML IJ SOLN: 4.0000 mg | Freq: Four times a day (QID) | INTRAMUSCULAR | Status: DC | PRN

## 2018-03-06 MED ORDER — ACETAMINOPHEN 325 MG PO TABS
325.0000 mg | ORAL_TABLET | ORAL | Status: DC | PRN
  Administered 2018-03-07 (×2): 650 mg via ORAL
  Filled 2018-03-06 (×2): qty 2

## 2018-03-06 SURGICAL SUPPLY — 9 items
CABLE SURGICAL S-101-97-12 (CABLE) ×3 IMPLANT
ELECT DEFIB PAD ADLT CADENCE (PAD) ×1 IMPLANT
HEMOSTAT SURGICEL 2X4 FIBR (HEMOSTASIS) ×1 IMPLANT
LEAD CAPSURE NOVUS 5076-52CM (Lead) ×1 IMPLANT
LEAD CAPSURE NOVUS 5076-58CM (Lead) ×1 IMPLANT
PACEMAKER ASSURITY DR-RF (Pacemaker) ×1 IMPLANT
PAD PRO RADIOLUCENT 2001M-C (PAD) ×2 IMPLANT
SHEATH CLASSIC 7F (SHEATH) ×2 IMPLANT
TRAY PACEMAKER INSERTION (PACKS) ×2 IMPLANT

## 2018-03-06 NOTE — H&P (View-Only) (Signed)
Cardiology Consultation:   Patient ID: Eric Diaz MRN: [id number]; DOB: [birth year]  Admit date: 03/05/2018 Date of Consult: 03/06/2018  Primary Care Provider: Phylliss Bob, MD Primary Cardiologist: No primary care provider on file.  Primary Electrophysiologist:  None    Patient Profile:   Eric Diaz is a 73 y.o. male with a hx of HTN, smoker who is being seen today for the evaluation of CHB at the request of Dr. Laren Everts.  History of Present Illness:   Eric Diaz late Nov 2019 had a syncopal event, he described as sudden on/off.  NO MVA, his wife was able to manage the car.  The episode in total lasting a few seconds, no syncope since. Since that time has felt a little "off", less energetic, though not until the last 2 weeks has he noted a significant decrease in his exertional capacity and increase in generalized fatigue.  He did not seek attention after the syncopal event, has not seen a doctor for 11 years until 3 weeks ago for an initial visit, this led to home monitoring of HR/BP, and an echo.  His wife states that for weeks his HR has been in the 30's, his BP high  The patient's wife reports that for the last 5 days especially he has noted him being extremely tired, worsening daily with increasing SOB  LABS K+ 3.9 BUN/Creat 12/0.89 Mag 2.0 BNP 1341 poc Trop 0.02 WBC 10.1 H/H 13/48 Plts 259 TSH 3.114  History reviewed. No pertinent past medical history.  Past Surgical History:  Procedure Laterality Date  . KNEE ARTHROSCOPY     left     Home Medications:  Prior to Admission medications   Medication Sig Start Date End Date Taking? Authorizing Provider  acetaminophen (TYLENOL) 500 MG tablet Take 1,000 mg by mouth every 6 (six) hours as needed for mild pain or moderate pain.   Yes [provider]  Cyanocobalamin (B-12 PO) Take 1 tablet by mouth daily.   Yes [provider]  ibuprofen (ADVIL,MOTRIN) 200 MG tablet Take 400 mg by mouth every 6  (six) hours as needed for mild pain or moderate pain.   Yes [provider]  Multiple Vitamin (MULTIVITAMIN WITH MINERALS) TABS tablet Take 1 tablet by mouth daily.   Yes [provider]    Inpatient Medications: Scheduled Meds: . Chlorhexidine Gluconate Cloth  6 each Topical Q0600  . mouth rinse  15 mL Mouth Rinse BID  . multivitamin with minerals  1 tablet Oral Daily  . mupirocin ointment  1 application Nasal BID  . [START ON 03/07/2018] pneumococcal 23 valent vaccine  0.5 mL Intramuscular Tomorrow-1000  . vitamin B-12  100 mcg Oral Daily   Continuous Infusions:  PRN Meds: acetaminophen, ondansetron (ZOFRAN) IV  Allergies:    Allergies  Allergen Reactions  . Bee Venom Anaphylaxis and Swelling    Social History:   Social History   Socioeconomic History  . Marital status: Married    Spouse name: Not on file  . Number of children: Not on file  . Years of education: Not on file  . Highest education level: Not on file  Occupational History  . Not on file  Social Needs  . Financial resource strain: Not on file  . Food insecurity:    Worry: Not on file    Inability: Not on file  . Transportation needs:    Medical: Not on file    Non-medical: Not on file  Tobacco Use  . Smoking  status: Current Every Day Smoker    Packs/day: 1.00  . Smokeless tobacco: Never Used  Substance and Sexual Activity  . Alcohol use: Yes    Comment: occasionally  . Drug use: No  . Sexual activity: Not on file  Lifestyle  . Physical activity:    Days per week: Not on file    Minutes per session: Not on file  . Stress: Not on file  Relationships  . Social connections:    Talks on phone: Not on file    Gets together: Not on file    Attends religious service: Not on file    Active member of club or organization: Not on file    Attends meetings of clubs or organizations: Not on file    Relationship status: Not on file  . Intimate partner violence:    Fear of current or ex  partner: Not on file    Emotionally abused: Not on file    Physically abused: Not on file    Forced sexual activity: Not on file  Other Topics Concern  . Not on file  Social History Narrative  . Not on file    Family History:   Mother had "cancer all over", father died of lung cancer, no known cardiac history  ROS:  Please see the history of present illness.  All other ROS reviewed and negative.     Physical Exam/Data:   Vitals:   03/05/18 2157 03/05/18 2300 03/05/18 2311 03/06/18 0300  BP: (!) 175/59  (!) 188/51 (!) 169/48  Pulse: (!) 35  (!) 34 (!) 34  Resp: (!) 22  19 20   Temp:   97.8 F (36.6 C) (!) 97.5 F (36.4 C)  TempSrc:   Oral Oral  SpO2: 96%  96% 93%  Weight:  73.5 kg    Height:  6' (1.829 m)      Intake/Output Summary (Last 24 hours) at 03/06/2018 0820 Last data filed at 03/06/2018 0529 Gross per 24 hour  Intake 260 ml  Output -  Net 260 ml   Filed Weights   03/05/18 1711 03/05/18 2300  Weight: 73.5 kg 73.5 kg   Body mass index is 21.98 kg/m.  General:  Well nourished, well developed, in no acute distress, though verbalizes feeling SOB, anxious HEENT: normal Lymph: no adenopathy Neck: no JVD Endocrine:  No thryomegaly Vascular: No carotid bruits Cardiac:  RRR, bradycardic; soft SM, no gallops or rubs Lungs:  Quiet throughout, no absent, soft end-exp wheezing, no rhonchi or rales  Abd: soft, nontender Ext: no edema Musculoskeletal:  No deformities,age appropriate atrophy Skin: warm and dry  Neuro:  No gross focal abnormalities noted Psych:  Normal affect   EKG:  The EKG was personally reviewed and demonstrates:    CHB, 33bpm, RBBB (no historical EKGs)  Telemetry:  Telemetry was personally reviewed and demonstrates:   CHB 30's  Relevant CV Studies:  Echo done at [facility] yesterday, unknown results  Laboratory Data:  Chemistry Recent Labs  Lab 03/05/18 1733  NA 131*  K 3.9  CL 101  CO2 21*  GLUCOSE 122*  BUN 12  CREATININE 0.89    CALCIUM 8.8*  GFRNONAA >60  GFRAA >60  ANIONGAP 9    No results for input(s): PROT, ALBUMIN, AST, ALT, ALKPHOS, BILITOT in the last 168 hours. Hematology Recent Labs  Lab 03/05/18 1733  WBC 10.1  RBC 3.99*  HGB 13.7  HCT 40.9  MCV 102.5*  MCH 34.3*  MCHC 33.5  RDW 12.6  PLT 259   Cardiac EnzymesNo results for input(s): TROPONINI in the last 168 hours.  Recent Labs  Lab 03/05/18 1742  TROPIPOC 0.02    BNP Recent Labs  Lab 03/05/18 1733  BNP 1,341.0*    DDimer No results for input(s): DDIMER in the last 168 hours.  Radiology/Studies:   Dg Chest Port 1 View Result Date: 03/05/2018 CLINICAL DATA:  73 y/o M; shortness of breath that began 1 week ago with cough. Patient now has generalized weakness and dizziness. EXAM: PORTABLE CHEST 1 VIEW COMPARISON:  None. FINDINGS: Normal cardiac silhouette given projection and technique. Transcutaneous pacing pads noted. Reticular opacities of the lungs. No consolidation or effusion. Pulmonary hyperinflation and emphysema. No acute osseous abnormality is evident. IMPRESSION: Reticular opacities of the lungs may represent interstitial edema or atypical pneumonia. COPD. Electronically Signed   By: Mitzi HansenLance  Furusawa-Stratton M.D.   On: 03/05/2018 18:07    Assessment and Plan:   1. CHB 2. Symptomatic bradycardia  No reversible causes noted Recommend PPM, discussed with the patient and wife indication/rational and procedure.  Discussed potential risks and  Benefits, they both are agreeable to proceed.   Stat echo is ordered  3. SOB/DOE     CHB and CHF is s/p lasix     Follow  4. HTN     No hx of this, likely to improve post pacing   For questions or updates, please contact [facility] Please consult [email or web address] for contact info under     Signed, Sheilah PigeonRenee Lynn Ursuy, PA-C  03/06/2018 8:20 AM   CHB    Syncope  Hypertension  CHF  DOE  The patient has persistent bradycardia complete heart block with a right bundle  branch left posterior fascicular block escape.  With symptoms, there is a class I indication for pacing.  The benefits and risks were reviewed including but not limited to death,  perforation, infection, lead dislodgement and device malfunction.  The patient understands agrees and is willing to proceed.\  We need to assess left ventricular function to decide as to the appropriate device for relief of heart block.  We will continue gentle diuresis.  Attention to hypertension following resolution of heart block.
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
## 2018-03-06 NOTE — Progress Notes (Signed)
  Echocardiogram 2D Echocardiogram has been performed.  Eric Diaz 03/06/2018, 10:04 AM

## 2018-03-06 NOTE — Interval H&P Note (Signed)
History and Physical Interval Note:  03/06/2018 1:46 PM  Eric Diaz  has presented today for surgery, with the diagnosis of Complete Heart Block  The various methods of treatment have been discussed with the patient and family. After consideration of risks, benefits and other options for treatment, the patient has consented to  Procedure(s): PACEMAKER IMPLANT (N/A) as a surgical intervention .  The patient's history has been reviewed, patient examined, no change in status, stable for surgery.  I have reviewed the patient's chart and labs.  Questions were answered to the patient's satisfaction.     Sherryl Manges  EF 55-60%
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
## 2018-03-07 ENCOUNTER — Inpatient Hospital Stay (HOSPITAL_COMMUNITY): Payer: Medicare HMO

## 2018-03-07 ENCOUNTER — Encounter (HOSPITAL_COMMUNITY): Payer: Self-pay | Admitting: Internal Medicine

## 2018-03-07 LAB — CBC
HCT: 37 % — ABNORMAL LOW (ref 39.0–52.0)
Hemoglobin: 12.7 g/dL — ABNORMAL LOW (ref 13.0–17.0)
MCH: 33.5 pg (ref 26.0–34.0)
MCHC: 34.3 g/dL (ref 30.0–36.0)
MCV: 97.6 fL (ref 80.0–100.0)
Platelets: 198 10*3/uL (ref 150–400)
RBC: 3.79 MIL/uL — AB (ref 4.22–5.81)
RDW: 12.5 % (ref 11.5–15.5)
WBC: 12.5 10*3/uL — ABNORMAL HIGH (ref 4.0–10.5)
nRBC: 0 % (ref 0.0–0.2)

## 2018-03-07 MED ORDER — ALBUTEROL SULFATE HFA 108 (90 BASE) MCG/ACT IN AERS: 1.0000 | INHALATION_SPRAY | Freq: Four times a day (QID) | RESPIRATORY_TRACT | 0 refills | Status: DC | PRN

## 2018-03-07 MED ORDER — LEVALBUTEROL TARTRATE 45 MCG/ACT IN AERO: 1.0000 | INHALATION_SPRAY | Freq: Four times a day (QID) | RESPIRATORY_TRACT | 0 refills | Status: DC | PRN

## 2018-03-07 MED ORDER — AZITHROMYCIN 250 MG PO TABS: ORAL_TABLET | ORAL | 0 refills | Status: AC

## 2018-03-07 MED ORDER — MUPIROCIN 2 % EX OINT: 1.0000 "application " | TOPICAL_OINTMENT | Freq: Two times a day (BID) | CUTANEOUS | 0 refills | Status: AC

## 2018-03-07 MED FILL — Lidocaine HCl Local Inj 1%: INTRAMUSCULAR | Qty: 60 | Status: AC

## 2018-03-07 MED FILL — AZITHROMYCIN 250 MG TABLET: 250 | 5 days supply | Qty: 6 | Fill #0

## 2018-03-07 NOTE — Progress Notes (Signed)
Patient is ready for discharge. He has all of his belongings with him. Vital signs are stable and he is alert and oriented. IV has been removed without complication, catheter intact. He has been removed from telemetry, CCMD notified. I have reviewed all discharge instructions with patient and answered all questions pertaining to his discharge.He will be transported home by his wife who is currently here with him. Patient will leave unit by wheelchair and meet wife at the front entrance of the hospital.

## 2018-03-07 NOTE — Discharge Instructions (Signed)
° ° °  Supplemental Discharge Instructions for  Pacemaker/Defibrillator Patients  Activity No heavy lifting or vigorous activity with your left/right arm for 6 to 8 weeks.  Do not raise your left/right arm above your head for one week.  Gradually raise your affected arm as drawn below.              03/10/2018                 03/11/2018               03/12/2018                03/13/2018 __  NO DRIVING for 1 week   ; you may begin driving on   0/11/2723  .  WOUND CARE - Keep the wound area clean and dry.  Do not get this area wet for one week. No showers for 24 hours; you may shower on 03/08/2018 . - The tape/steri-strips on your wound will fall off; do not pull them off.  No bandage is needed on the site.  DO  NOT apply any creams, oils, or ointments to the wound area. - If you notice any drainage or discharge from the wound, any swelling or bruising at the site, or you develop a fever > 101? F after you are discharged home, call the office at once.  Special Instructions - You are still able to use cellular telephones; use the ear opposite the side where you have your pacemaker/defibrillator.  Avoid carrying your cellular phone near your device. - When traveling through airports, show security personnel your identification card to avoid being screened in the metal detectors.  Ask the security personnel to use the hand wand. - Avoid arc welding equipment, MRI testing (magnetic resonance imaging), TENS units (transcutaneous nerve stimulators).  Call the office for questions about other devices. - Avoid electrical appliances that are in poor condition or are not properly grounded. - Microwave ovens are safe to be near or to operate.

## 2018-03-08 ENCOUNTER — Other Ambulatory Visit: Payer: Self-pay

## 2018-03-08 MED ORDER — ALBUTEROL SULFATE HFA 108 (90 BASE) MCG/ACT IN AERS: 1.0000 | INHALATION_SPRAY | Freq: Four times a day (QID) | RESPIRATORY_TRACT | 0 refills | Status: DC | PRN

## 2018-03-19 ENCOUNTER — Telehealth: Payer: Self-pay | Admitting: Internal Medicine

## 2018-03-19 NOTE — Telephone Encounter (Signed)
Pt's wife calling asking about a letter for reimbursement for deposit on a [employer] cruise. Wife states Dr. Graciela Husbands offered to help, so they need a letter to [employer] for [employer]. Stating his procedure and advisement he not trave during recovery time. Would like to pick up at appt on 1-27-Monday

## 2018-03-20 ENCOUNTER — Ambulatory Visit: Payer: Managed Care, Other (non HMO)

## 2018-03-25 ENCOUNTER — Other Ambulatory Visit: Payer: Self-pay

## 2018-03-25 ENCOUNTER — Ambulatory Visit (HOSPITAL_COMMUNITY)
Admission: RE | Disposition: A | Discharge status: Home / Self Care | Payer: Self-pay | Source: Ambulatory Visit | Attending: Internal Medicine

## 2018-03-25 ENCOUNTER — Ambulatory Visit (HOSPITAL_COMMUNITY)
Admission: RE | Admit: 2018-03-25 | Discharge: 2018-03-26 | Disposition: A | Discharge status: Home / Self Care | Payer: Medicare HMO | Source: Ambulatory Visit | Attending: Internal Medicine | Admitting: Internal Medicine

## 2018-03-25 ENCOUNTER — Ambulatory Visit: Payer: Managed Care, Other (non HMO) | Admitting: Internal Medicine

## 2018-03-25 ENCOUNTER — Encounter: Payer: Self-pay | Admitting: Internal Medicine

## 2018-03-25 ENCOUNTER — Ambulatory Visit (INDEPENDENT_AMBULATORY_CARE_PROVIDER_SITE_OTHER): Payer: Managed Care, Other (non HMO) | Admitting: Nurse Practitioner

## 2018-03-25 ENCOUNTER — Ambulatory Visit
Admission: RE | Admit: 2018-03-25 | Discharge: 2018-03-25 | Disposition: A | Discharge status: Home / Self Care | Payer: Managed Care, Other (non HMO) | Source: Ambulatory Visit | Attending: Nurse Practitioner | Admitting: Nurse Practitioner

## 2018-03-25 VITALS — BP 142/70 | HR 80 | Ht 72.0 in | Wt 152.0 lb

## 2018-03-25 DIAGNOSIS — Z950 Presence of cardiac pacemaker: Secondary | ICD-10-CM

## 2018-03-25 DIAGNOSIS — R0600 Dyspnea, unspecified: Secondary | ICD-10-CM | POA: Insufficient documentation

## 2018-03-25 DIAGNOSIS — I110 Hypertensive heart disease with heart failure: Secondary | ICD-10-CM | POA: Insufficient documentation

## 2018-03-25 DIAGNOSIS — T82120A Displacement of cardiac electrode, initial encounter: Secondary | ICD-10-CM | POA: Insufficient documentation

## 2018-03-25 DIAGNOSIS — I509 Heart failure, unspecified: Secondary | ICD-10-CM | POA: Insufficient documentation

## 2018-03-25 DIAGNOSIS — I442 Atrioventricular block, complete: Secondary | ICD-10-CM

## 2018-03-25 DIAGNOSIS — Z959 Presence of cardiac and vascular implant and graft, unspecified: Secondary | ICD-10-CM

## 2018-03-25 DIAGNOSIS — Y831 Surgical operation with implant of artificial internal device as the cause of abnormal reaction of the patient, or of later complication, without mention of misadventure at the time of the procedure: Secondary | ICD-10-CM | POA: Diagnosis not present

## 2018-03-25 DIAGNOSIS — J449 Chronic obstructive pulmonary disease, unspecified: Secondary | ICD-10-CM | POA: Insufficient documentation

## 2018-03-25 DIAGNOSIS — T82110A Breakdown (mechanical) of cardiac electrode, initial encounter: Secondary | ICD-10-CM

## 2018-03-25 HISTORY — PX: LEAD REVISION/REPAIR: EP1213

## 2018-03-25 LAB — CUP PACEART INCLINIC DEVICE CHECK
Battery Remaining Longevity: 49 mo
Battery Voltage: 3.04 V
Brady Statistic RA Percent Paced: 0.2 %
Brady Statistic RV Percent Paced: 99.97 %
Date Time Interrogation Session: 20200127174437
Implantable Lead Implant Date: 20200108
Implantable Lead Implant Date: 20200108
Implantable Lead Location: 753859
Implantable Lead Location: 753860
Implantable Lead Model: 5076
Implantable Pulse Generator Implant Date: 20200108
Lead Channel Pacing Threshold Amplitude: 2.25 V
Lead Channel Setting Pacing Amplitude: 5 V
Lead Channel Setting Sensing Sensitivity: 2 mV
MDC IDC MSMT LEADCHNL RV PACING THRESHOLD PULSEWIDTH: 1 ms
MDC IDC PG SERIAL: 9091999
MDC IDC SET LEADCHNL RA PACING AMPLITUDE: 3.5 V
MDC IDC SET LEADCHNL RV PACING PULSEWIDTH: 1 ms
Pulse Gen Model: 2272

## 2018-03-25 SURGERY — LEAD REVISION/REPAIR
Anesthesia: LOCAL

## 2018-03-25 MED ORDER — LIDOCAINE HCL (PF) 1 % IJ SOLN
INTRAMUSCULAR | Status: AC
  Filled 2018-03-25: qty 60

## 2018-03-25 MED ORDER — SODIUM CHLORIDE 0.9 % IV SOLN
INTRAVENOUS | Status: DC
  Administered 2018-03-25: 16:00:00 via INTRAVENOUS

## 2018-03-25 MED ORDER — MUPIROCIN 2 % EX OINT
TOPICAL_OINTMENT | CUTANEOUS | Status: AC
  Filled 2018-03-25: qty 22

## 2018-03-25 MED ORDER — LIDOCAINE HCL (PF) 1 % IJ SOLN
INTRAMUSCULAR | Status: DC | PRN
  Administered 2018-03-25: 60 mL

## 2018-03-25 MED ORDER — SODIUM CHLORIDE 0.9 % IV SOLN: INTRAVENOUS | Status: AC

## 2018-03-25 MED ORDER — CEFAZOLIN SODIUM-DEXTROSE 2-4 GM/100ML-% IV SOLN
2.0000 g | INTRAVENOUS | Status: AC
  Administered 2018-03-25: 2 g via INTRAVENOUS

## 2018-03-25 MED ORDER — MIDAZOLAM HCL 5 MG/5ML IJ SOLN
INTRAMUSCULAR | Status: AC
  Filled 2018-03-25: qty 5

## 2018-03-25 MED ORDER — ACETAMINOPHEN 325 MG PO TABS
325.0000 mg | ORAL_TABLET | ORAL | Status: DC | PRN
  Administered 2018-03-25 – 2018-03-26 (×2): 650 mg via ORAL
  Filled 2018-03-25 (×3): qty 2

## 2018-03-25 MED ORDER — SODIUM CHLORIDE 0.9 % IV SOLN
80.0000 mg | INTRAVENOUS | Status: AC
  Administered 2018-03-25: 80 mg

## 2018-03-25 MED ORDER — CEFAZOLIN SODIUM-DEXTROSE 2-4 GM/100ML-% IV SOLN
INTRAVENOUS | Status: AC
  Filled 2018-03-25: qty 100

## 2018-03-25 MED ORDER — HEPARIN (PORCINE) IN NACL 1000-0.9 UT/500ML-% IV SOLN
INTRAVENOUS | Status: AC
  Filled 2018-03-25: qty 500

## 2018-03-25 MED ORDER — HEPARIN (PORCINE) IN NACL 1000-0.9 UT/500ML-% IV SOLN
INTRAVENOUS | Status: DC | PRN
  Administered 2018-03-25: 500 mL

## 2018-03-25 MED ORDER — FENTANYL CITRATE (PF) 100 MCG/2ML IJ SOLN
INTRAMUSCULAR | Status: AC
  Filled 2018-03-25: qty 2

## 2018-03-25 MED ORDER — ADULT MULTIVITAMIN W/MINERALS CH
1.0000 | ORAL_TABLET | Freq: Every day | ORAL | Status: DC
  Administered 2018-03-26: 1 via ORAL
  Filled 2018-03-25: qty 1

## 2018-03-25 MED ORDER — MOMETASONE FURO-FORMOTEROL FUM 200-5 MCG/ACT IN AERO
2.0000 | INHALATION_SPRAY | Freq: Two times a day (BID) | RESPIRATORY_TRACT | Status: DC
  Administered 2018-03-25 – 2018-03-26 (×2): 2 via RESPIRATORY_TRACT
  Filled 2018-03-25: qty 8.8

## 2018-03-25 MED ORDER — IBUPROFEN 200 MG PO TABS
400.0000 mg | ORAL_TABLET | Freq: Four times a day (QID) | ORAL | Status: DC | PRN
  Administered 2018-03-25: 400 mg via ORAL
  Filled 2018-03-25: qty 2

## 2018-03-25 MED ORDER — CEFAZOLIN SODIUM-DEXTROSE 1-4 GM/50ML-% IV SOLN
1.0000 g | Freq: Four times a day (QID) | INTRAVENOUS | Status: AC
  Administered 2018-03-25 – 2018-03-26 (×3): 1 g via INTRAVENOUS
  Filled 2018-03-25 (×3): qty 50

## 2018-03-25 MED ORDER — CARVEDILOL 12.5 MG PO TABS
12.5000 mg | ORAL_TABLET | Freq: Two times a day (BID) | ORAL | Status: DC
  Administered 2018-03-25 – 2018-03-26 (×2): 12.5 mg via ORAL
  Filled 2018-03-25 (×2): qty 1

## 2018-03-25 MED ORDER — ONDANSETRON HCL 4 MG/2ML IJ SOLN: 4.0000 mg | Freq: Four times a day (QID) | INTRAMUSCULAR | Status: DC | PRN

## 2018-03-25 MED ORDER — ALBUTEROL SULFATE (2.5 MG/3ML) 0.083% IN NEBU
3.0000 mL | INHALATION_SOLUTION | Freq: Four times a day (QID) | RESPIRATORY_TRACT | Status: DC | PRN
  Administered 2018-03-26: 3 mL via RESPIRATORY_TRACT
  Filled 2018-03-25: qty 3

## 2018-03-25 MED ORDER — FENTANYL CITRATE (PF) 100 MCG/2ML IJ SOLN
INTRAMUSCULAR | Status: DC | PRN
  Administered 2018-03-25: 50 ug via INTRAVENOUS

## 2018-03-25 MED ORDER — ACETAMINOPHEN 500 MG PO TABS: 1000.0000 mg | ORAL_TABLET | Freq: Four times a day (QID) | ORAL | Status: DC | PRN

## 2018-03-25 MED ORDER — SODIUM CHLORIDE 0.9 % IV SOLN
INTRAVENOUS | Status: AC
  Filled 2018-03-25: qty 2

## 2018-03-25 MED ORDER — MIDAZOLAM HCL 5 MG/5ML IJ SOLN
INTRAMUSCULAR | Status: DC | PRN
  Administered 2018-03-25: 2 mg via INTRAVENOUS

## 2018-03-25 SURGICAL SUPPLY — 7 items
CABLE SURGICAL S-101-97-12 (CABLE) ×2 IMPLANT
ELECT DEFIB PAD ADLT CADENCE (PAD) ×1 IMPLANT
KIT MICROPUNCTURE NIT STIFF (SHEATH) ×1 IMPLANT
LEAD CAPSURE NOVUS 5076-58CM (Lead) ×1 IMPLANT
PAD PRO RADIOLUCENT 2001M-C (PAD) ×2 IMPLANT
SHEATH CLASSIC 7F (SHEATH) ×1 IMPLANT
TRAY PACEMAKER INSERTION (PACKS) ×2 IMPLANT

## 2018-03-25 NOTE — Progress Notes (Signed)
      Patient Care Team: System, Pcp Not In as PCP - General   HPI  Eric Diaz is a 73 y.o. male Seen as an add-on today because of high ventricular pacing thresholds.  He had presented 1/20 with complete heart block manifested by syncope as well as significant dyspnea on exertion.  He underwent pacing-Saint Jude.  No interval worsening symptoms but on threshold evaluation today elevated thresholds were noted both bipolar and unipolar.  Chest x-ray demonstrates no significant lead migration.  There may be a little bit less slack.  Records and Results Reviewed   No past medical history on file.  Past Surgical History:  Procedure Laterality Date  . KNEE ARTHROSCOPY     left  . PACEMAKER IMPLANT N/A 03/06/2018   Procedure: PACEMAKER IMPLANT;  Surgeon: Duke Salvia, MD;  Location: [facility] INVASIVE CV LAB;  Service: Cardiovascular;  Laterality: N/A;    Current Meds  Medication Sig  . acetaminophen (TYLENOL) 500 MG tablet Take 1,000 mg by mouth every 6 (six) hours as needed for mild pain or moderate pain.  Marland Kitchen albuterol (PROVENTIL HFA;VENTOLIN HFA) 108 (90 Base) MCG/ACT inhaler Inhale 1 puff into the lungs every 6 (six) hours as needed for wheezing or shortness of breath.  . carvedilol (COREG) 12.5 MG tablet Take 1 tablet by mouth 2 (two) times daily.  . Cyanocobalamin (B-12 PO) Take 1 tablet by mouth daily.  Marland Kitchen ibuprofen (ADVIL,MOTRIN) 200 MG tablet Take 400 mg by mouth every 6 (six) hours as needed for mild pain or moderate pain.  . Multiple Vitamin (MULTIVITAMIN WITH MINERALS) TABS tablet Take 1 tablet by mouth daily.  Monte Fantasia INHUB 250-50 MCG/DOSE AEPB Inhale 1 puff into the lungs daily.    Allergies  Allergen Reactions  . Bee Venom Anaphylaxis and Swelling      Review of Systems negative except from HPI and PMH  Physical Exam BP (!) 142/70   Pulse 80   Ht 6' (1.829 m)   Wt 152 lb (68.9 kg)   SpO2 100%   BMI 20.61 kg/m  Well developed and well nourished in no acute  distress HENT normal E scleral and icterus clear Neck Supple JVP flat; carotids brisk and full Clear to ausculation Pocket without  hematoma, swelling or tenderness  Regular rate and rhythm, no murmurs gallops or rub Soft with active bowel sounds No clubbing cyanosis  Edema Alert and oriented, grossly normal motor and sensory function Skin Warm and Dry    Assessment and  Plan  CHB    Syncope  Hypertension  CHF  DOE  Pacemaker-Saint Jude  Elevated RV pacing threshold   The patient's pacing threshold was elevated.  Given that he is device dependent, that requires revision.  I have reviewed this with the family with the potential benefits and risks including but not limited to pneumothorax, perforation, and infection.  With his device dependence, we will plan on starting a new lead prior to removing the old lead.     Current medicines are reviewed at length with the patient today .  The patient does not  have concerns regarding medicines.

## 2018-03-25 NOTE — Progress Notes (Signed)
Pt completed his 10 doses of mupirocin

## 2018-03-25 NOTE — Patient Instructions (Signed)
Medication Instructions:  Your physician recommends that you continue on your current medications as directed. Please refer to the Current Medication list given to you today.  Labwork: None ordered.  Testing/Procedures: None ordered.  Follow-Up: Your physician recommends that you schedule a follow-up appointment in:   10-14 days after your lead revision for a wound check.  91 days after your lead revision for a device check with Dr Graciela HusbandsKlein.   Any Other Special Instructions Will Be Listed Below (If Applicable).     If you need a refill on your cardiac medications before your next appointment, please call your pharmacy.

## 2018-03-25 NOTE — Progress Notes (Signed)
Wound check appointment. Wound without redness or edema. Incision edges approximated, wound well healed. RV threshold significantly increased from implant - now 3.25V @ 0.80msec. Pt pacemaker dependent today.  Output increased to 5V @1msec  and pt sent for CXR. Patient to see Dr Graciela Husbands this afternoon after CXR done. No dizziness, pre-syncope or syncope

## 2018-03-26 ENCOUNTER — Ambulatory Visit (HOSPITAL_COMMUNITY): Payer: Medicare HMO

## 2018-03-26 DIAGNOSIS — T82120A Displacement of cardiac electrode, initial encounter: Secondary | ICD-10-CM | POA: Diagnosis not present

## 2018-03-26 DIAGNOSIS — I442 Atrioventricular block, complete: Secondary | ICD-10-CM | POA: Diagnosis not present

## 2018-03-26 DIAGNOSIS — R0600 Dyspnea, unspecified: Secondary | ICD-10-CM | POA: Diagnosis not present

## 2018-03-26 DIAGNOSIS — I110 Hypertensive heart disease with heart failure: Secondary | ICD-10-CM | POA: Diagnosis not present

## 2018-03-26 MED FILL — Lidocaine HCl Local Inj 1%: INTRAMUSCULAR | Qty: 60 | Status: AC

## 2018-03-26 NOTE — Telephone Encounter (Signed)
Spoke with pt's wife. She states pt may not need the letter at this time, but will contact us back if needed. I asked her if she needed a letter, please send [place] a short statement letting us know what the cruise line is requesting. We will write and sign the letter on [facility] letterhead. Pt's wife verbalized understanding and had no additional questions.

## 2018-03-26 NOTE — Discharge Instructions (Signed)
° ° °  Supplemental Discharge Instructions for  °Pacemaker/Defibrillator Patients ° °Activity °No heavy lifting or vigorous activity with your left/right arm for 6 to 8 weeks.  Do not raise your left/right arm above your head for one week.  Gradually raise your affected arm as drawn below. ° °        °    03/31/2018                  04/01/2018                  04/02/2018                 04/03/2018 °__ ° °NO DRIVING for  1 week   ; you may begin driving on  04/03/2018   . ° °WOUND CARE °- Keep the wound area clean and dry.  Do not get this area wet for one week. No showers for one week; you may shower on  04/03/2018   . °- The tape/steri-strips on your wound will fall off; do not pull them off.  No bandage is needed on the site.  DO  NOT apply any creams, oils, or ointments to the wound area. °- If you notice any drainage or discharge from the wound, any swelling or bruising at the site, or you develop a fever > 101? F after you are discharged home, call the office at once. ° °Special Instructions °- You are still able to use cellular telephones; use the ear opposite the side where you have your pacemaker/defibrillator.  Avoid carrying your cellular phone near your device. °- When traveling through airports, show security personnel your identification card to avoid being screened in the metal detectors.  Ask the security personnel to use the hand wand. °- Avoid arc welding equipment, MRI testing (magnetic resonance imaging), TENS units (transcutaneous nerve stimulators).  Call the office for questions about other devices. °- Avoid electrical appliances that are in poor condition or are not properly grounded. °- Microwave ovens are safe to be near or to operate. ° ° °

## 2018-03-26 NOTE — Discharge Summary (Signed)
ELECTROPHYSIOLOGY PROCEDURE DISCHARGE SUMMARY    Patient ID: Eric Diaz,  MRN: [id number], DOB/AGE: 72-Apr-1947 72 y.o.  Admit date: 03/25/2018 Discharge date: 03/26/2018  Primary Care Physician: System, Pcp Not In  Primary Cardiologist/Electrophysiologist: Dr. Graciela Husbands  Primary Discharge Diagnosis:  1. PPM lead dislodgement (micro) 2. CHB  Secondary Discharge Diagnosis:  1. HTN   Allergies  Allergen Reactions  . Bee Venom Anaphylaxis and Swelling     Procedures This Admission:  1.  03/26/2018: PPM RV lead removal and new lead implant, Dr. Graciela Husbands  Brief HPI: Eric Diaz is a 73 y.o. male was seen at his scheduled wound check visit post PPM implant 03/06/2018.  It was noted that his RV lead threshold were elevated.  He was device dependent, outputs adjusted for safety and sent for CXR.  He saw Dr. Graciela Husbands who did not appreciate any gross lead migration, though given thresholds, felt to micro-dislodgement and referred for PPM lead revison, same day given symptomatic CHB was his implant indication and was device dependent.     Hospital Course:  The patient was admitted and underwent RV lead removal with implantation of a new RV lead, (see procedure report for full details).  He was monitored on telemetry overnight which demonstrated SR/V paced rhythm, no bradycardia or evidence of loss of capture (outside of device testing this AM).  Left chest was without hematoma or ecchymosis.  The device was interrogated and found to be functioning normally.  CXR was obtained and demonstrated no pneumothorax status post device implantation.  Wound care, arm mobility, and restrictions were reviewed with the patient.  The patient feels well, no CP or SOB.  He reports moderate site discomfort but declines pain medicine.  He was examined by Dr. Graciela Husbands and considered stable for discharge to home.    Physical Exam: Vitals:   03/25/18 2127 03/26/18 0525 03/26/18 0846 03/26/18 0912  BP:  (!)  163/77  (!) 149/73  Pulse:  71 72 70  Resp:   16   Temp:  98.3 F (36.8 C)    TempSrc:  Oral    SpO2: 94% 95% 94%   Weight:  68 kg    Height:        GEN- The patient is well appearing, alert and oriented x 3 today.   HEENT: normocephalic, atraumatic; sclera clear, conjunctiva pink; hearing intact; oropharynx clear; neck supple, no JVP Lungs- CTA b/l, normal work of breathing.  No wheezes, rales, rhonchi Heart- RRR, no murmurs, rubs or gallops, PMI not laterally displaced GI- soft, non-tender, non-distended Extremities- no clubbing, cyanosis, or edema MS- no significant deformity or atrophy Skin- warm and dry, no rash or lesion, left chest without hematoma/ecchymosis Psych- euthymic mood, full affect Neuro- no gross deficits   Labs:   Lab Results  Component Value Date   WBC 12.5 (H) 03/07/2018   HGB 12.7 (L) 03/07/2018   HCT 37.0 (L) 03/07/2018   MCV 97.6 03/07/2018   PLT 198 03/07/2018   No results for input(s): NA, K, CL, CO2, BUN, CREATININE, CALCIUM, PROT, BILITOT, ALKPHOS, ALT, AST, GLUCOSE in the last 168 hours.  Invalid input(s): LABALBU  Discharge Medications:  Allergies as of 03/26/2018      Reactions   Bee Venom Anaphylaxis, Swelling      Medication List    TAKE these medications   acetaminophen 500 MG tablet Commonly known as:  TYLENOL Take 1,000 mg by mouth every 6 (six) hours as needed for mild pain or  moderate pain.   albuterol 108 (90 Base) MCG/ACT inhaler Commonly known as:  PROVENTIL HFA;VENTOLIN HFA Inhale 1 puff into the lungs every 6 (six) hours as needed for wheezing or shortness of breath.   B-12 PO Take 1 tablet by mouth daily.   carvedilol 12.5 MG tablet Commonly known as:  COREG Take 1 tablet by mouth 2 (two) times daily.   CHANTIX CONTINUING MONTH PAK 1 MG tablet Generic drug:  varenicline   ibuprofen 200 MG tablet Commonly known as:  ADVIL,MOTRIN Take 400 mg by mouth every 6 (six) hours as needed for mild pain or moderate  pain.   multivitamin with minerals Tabs tablet Take 1 tablet by mouth daily.   WIXELA INHUB 250-50 MCG/DOSE Aepb Generic drug:  Fluticasone-Salmeterol Inhale 1 puff into the lungs daily.       Disposition:  Home Discharge Instructions    Diet - low sodium heart healthy   Complete by:  As directed    Increase activity slowly   Complete by:  As directed      Follow-up Information    Marily Lente, NP Follow up.   Specialty:  Cardiology Why:  04/08/2018 @ 9:40AM, wound check visit Contact information: [address] [place] [address] [phone]        Duke Salvia, MD Follow up.   Specialty:  Cardiology Why:  06/27/2018 @ 2:15PM Contact information: [address] [place] [address] [phone]           Duration of Discharge Encounter: Greater than 30 minutes including physician time.  Norma Fredrickson, PA-C 03/26/2018 12:53 PM

## 2018-03-27 ENCOUNTER — Encounter (HOSPITAL_COMMUNITY): Payer: Self-pay | Admitting: Internal Medicine

## 2018-04-05 ENCOUNTER — Encounter: Payer: Self-pay | Admitting: Internal Medicine

## 2018-04-05 NOTE — Progress Notes (Signed)
This note addendum to the operative note from 03/26/2018 on Mr. Eric Diaz.  He presented with a failed RV lead.  He was device dependent.  A new lead was inserted as described in the original report.  Following insertion of the new lead, the old lead was extracted under fluoroscopic guidance.

## 2018-04-08 ENCOUNTER — Encounter (INDEPENDENT_AMBULATORY_CARE_PROVIDER_SITE_OTHER): Payer: Self-pay

## 2018-04-08 ENCOUNTER — Ambulatory Visit (INDEPENDENT_AMBULATORY_CARE_PROVIDER_SITE_OTHER): Payer: Medicare HMO | Admitting: Nurse Practitioner

## 2018-04-08 DIAGNOSIS — I442 Atrioventricular block, complete: Secondary | ICD-10-CM | POA: Diagnosis not present

## 2018-04-08 LAB — CUP PACEART INCLINIC DEVICE CHECK
Date Time Interrogation Session: 20200210095051
Implantable Lead Implant Date: 20200108
Implantable Lead Location: 753860
Implantable Lead Location: 753860
Implantable Lead Model: 5076
Implantable Lead Model: 5076
Implantable Pulse Generator Implant Date: 20200108
MDC IDC LEAD IMPLANT DT: 20200108
Pulse Gen Serial Number: 9091999

## 2018-04-08 NOTE — Patient Instructions (Signed)
Medication Instructions:  NONE If you need a refill on your cardiac medications before your next appointment, please call your pharmacy.   Lab work: NONE If you have labs (blood work) drawn today and your tests are completely normal, you will receive your results only by: . MyChart Message (if you have MyChart) OR . A paper copy in the mail If you have any lab test that is abnormal or we need to change your treatment, we will call you to review the results.  Testing/Procedures: NONE  Follow-Up: At [facility], you and your health needs are our priority.  As part of our continuing mission to provide you with exceptional heart care, we have created designated Provider Care Teams.  These Care Teams include your primary Cardiologist (physician) and Advanced Practice Providers (APPs -  Physician Assistants and Nurse Practitioners) who all work together to provide you with the care you need, when you need it. .   Any Other Special Instructions Will Be Listed Below (If Applicable).    

## 2018-04-08 NOTE — Progress Notes (Signed)

## 2018-05-27 ENCOUNTER — Telehealth: Payer: Self-pay | Admitting: Internal Medicine

## 2018-05-27 NOTE — Telephone Encounter (Signed)
Per device clinic, pt does not have any activity restrictions at this time. It is recommended he stays away from "backpack" type power equipment. Pt's wife has been made aware and verbalized understanding. She has no additional questions.

## 2018-05-27 NOTE — Telephone Encounter (Signed)
Wife of pt called. Pt wants to know if it is safe for him to be out and about doing work in the yard, because if the pt shouldn't be doing it, the wife says they will need to hire someone to do the yard work. The pt also wants to know if it is safe for him to be out riding his motorcycle.

## 2018-06-13 ENCOUNTER — Encounter: Payer: Managed Care, Other (non HMO) | Admitting: Internal Medicine

## 2018-06-18 ENCOUNTER — Telehealth: Payer: Self-pay

## 2018-06-18 NOTE — Telephone Encounter (Signed)
Pt's wife has agreed with televisit with Dr Graciela Husbands. She understands she should send in a remote transmission prior to his appt. Pt has low energy and this is also a 91 day follow up.   I will forward to the device clinic for them to call her and talk her through a manual transmission.

## 2018-06-18 NOTE — Telephone Encounter (Signed)
LMOVM for pt to return call 

## 2018-06-18 NOTE — Telephone Encounter (Signed)
I called and spoke with patients wife Eric Diaz. She does not want to do a video visit or telephone call for office visit. She states that she feels patient is not doing as well as he should be. She wants him to come in for an office visit. Please call patients wife to discuss.

## 2018-06-19 NOTE — Telephone Encounter (Signed)
Spoke w/ pt. Instructed pt how to send a manual transmission w/ his home monitor. Transmission received.

## 2018-06-20 ENCOUNTER — Other Ambulatory Visit: Payer: Self-pay

## 2018-06-20 ENCOUNTER — Ambulatory Visit (INDEPENDENT_AMBULATORY_CARE_PROVIDER_SITE_OTHER): Payer: Medicare HMO | Admitting: *Deleted

## 2018-06-20 DIAGNOSIS — I442 Atrioventricular block, complete: Secondary | ICD-10-CM

## 2018-06-20 LAB — CUP PACEART REMOTE DEVICE CHECK
Battery Remaining Longevity: 70 mo
Battery Remaining Percentage: 95.5 %
Battery Voltage: 3.01 V
Brady Statistic AP VP Percent: 1 %
Brady Statistic AP VS Percent: 1 %
Brady Statistic AS VP Percent: 99 %
Brady Statistic AS VS Percent: 1 %
Brady Statistic RA Percent Paced: 1 %
Brady Statistic RV Percent Paced: 99 %
Date Time Interrogation Session: 20200422190313
Implantable Lead Implant Date: 20200108
Implantable Lead Implant Date: 20200108
Implantable Lead Location: 753860
Implantable Lead Location: 753860
Implantable Lead Model: 5076
Implantable Lead Model: 5076
Implantable Pulse Generator Implant Date: 20200108
Lead Channel Impedance Value: 400 Ohm
Lead Channel Impedance Value: 480 Ohm
Lead Channel Sensing Intrinsic Amplitude: 5 mV
Lead Channel Setting Pacing Amplitude: 3.5 V
Lead Channel Setting Pacing Amplitude: 3.5 V
Lead Channel Setting Pacing Pulse Width: 0.4 ms
Lead Channel Setting Sensing Sensitivity: 4 mV
Pulse Gen Model: 2272
Pulse Gen Serial Number: 9091999

## 2018-06-27 ENCOUNTER — Other Ambulatory Visit: Payer: Self-pay

## 2018-06-27 ENCOUNTER — Telehealth (INDEPENDENT_AMBULATORY_CARE_PROVIDER_SITE_OTHER): Payer: Medicare HMO | Admitting: Internal Medicine

## 2018-06-27 VITALS — BP 142/62 | HR 80 | Ht 72.0 in | Wt 158.0 lb

## 2018-06-27 DIAGNOSIS — I442 Atrioventricular block, complete: Secondary | ICD-10-CM

## 2018-06-27 DIAGNOSIS — Z950 Presence of cardiac pacemaker: Secondary | ICD-10-CM

## 2018-06-27 NOTE — Patient Instructions (Signed)
The following was discussed over the telephone with pt's wife:   Medication Instructions:  Your physician recommends that you continue on your current medications as directed. Please refer to the Current Medication list given to you today.   Labwork: None ordered.   Testing/Procedures: None ordered.   Follow-Up: Your physician recommends that you schedule a follow-up appointment in:   June 22 with Device Clinic 9 mo with Dr Graciela Husbands  Any Other Special Instructions Will Be Listed Below (If Applicable).     If you need a refill on your cardiac medications before your next appointment, please call your pharmacy.

## 2018-06-27 NOTE — Progress Notes (Signed)
Electrophysiology TeleHealth Note   Due to national recommendations of social distancing due to COVID 19, an audio/video telehealth visit is felt to be most appropriate for this patient at this time.  See MyChart message from today for the patient's consent to telehealth for Eric Diaz.   Date:  06/27/2018   ID:  [name], DOB 1945-12-08, MRN [id number]  Location: patient's home  Provider location: [address] [place]  Evaluation Performed: Follow-up visit  PCP:  System, Pcp Not In    Electrophysiologist:  SK   Chief Complaint:  *complete heart block   History of Present Illness:    Eric Diaz is a 73 y.o. male who presents via audio/video conferencing for a telehealth visit today.  Since last being seen in our clinic for complete heart block with post implant rising ventricular pacing thresholds for which he underwent revision, the patient reports doing quite well  No CP, but some device pocket tenderness-- no assoc erythema  SOB is chronic, still smoking but hopes to stop  No edema or palpitations   1/20- Cr 0.9, K 3.9, Hgb 12.7   The patient denies symptoms of fevers, chills, cough, or new SOB worrisome for COVID 19.    No past medical history on file.  Past Surgical History:  Procedure Laterality Date  . KNEE ARTHROSCOPY     left  . LEAD REVISION/REPAIR N/A 03/25/2018   Procedure: LEAD REVISION/REPAIR;  Surgeon: Duke Salvia, MD;  Location: [facility] INVASIVE CV LAB;  Service: Cardiovascular;  Laterality: N/A;  . PACEMAKER IMPLANT N/A 03/06/2018   Procedure: PACEMAKER IMPLANT;  Surgeon: Duke Salvia, MD;  Location: [facility] INVASIVE CV LAB;  Service: Cardiovascular;  Laterality: N/A;    Current Outpatient Medications  Medication Sig Dispense Refill  . acetaminophen (TYLENOL) 500 MG tablet Take 1,000 mg by mouth every 6 (six) hours as needed for mild pain or moderate pain.    Marland Kitchen albuterol (PROVENTIL HFA;VENTOLIN HFA) 108 (90 Base)  MCG/ACT inhaler Inhale 1 puff into the lungs every 6 (six) hours as needed for wheezing or shortness of breath. 1 Inhaler 0  . carvedilol (COREG) 12.5 MG tablet Take 1 tablet by mouth 2 (two) times daily.    . Cyanocobalamin (B-12 PO) Take 1 tablet by mouth daily.    Marland Kitchen ibuprofen (ADVIL,MOTRIN) 200 MG tablet Take 400 mg by mouth every 6 (six) hours as needed for mild pain or moderate pain.    . Multiple Vitamin (MULTIVITAMIN WITH MINERALS) TABS tablet Take 1 tablet by mouth daily.    Monte Fantasia INHUB 250-50 MCG/DOSE AEPB Inhale 1 puff into the lungs daily.    Rhea Bleacher CONTINUING MONTH PAK 1 MG tablet      No current facility-administered medications for this visit.     Allergies:   Bee venom   Social History:  The patient  reports that he has been smoking. He has been smoking about 1.00 pack per day. He has never used smokeless tobacco. He reports current alcohol use. He reports that he does not use drugs.   Family History:  The patient's   family history is not on file.   ROS:  Please see the history of present illness.   All other systems are personally reviewed and negative.    Exam:    Vital Signs:  BP (!) 142/62   Pulse 80   Ht 6' (1.829 m)   Wt 158 lb (71.7 kg)   BMI 21.43  kg/m     Well appearing, alert and conversant, regular work of breathing,  good skin color Eyes- anicteric, neuro- grossly intact, skin- no apparent rash or lesions or cyanosis, mouth- oral mucosa is pink  Device pocket well healed; without hematoma or erythema.  There is no tethering   Labs/Other Tests and Data Reviewed:    Recent Labs: 03/05/2018: B Natriuretic Peptide 1,341.0; BUN 12; Creatinine, Ser 0.89; Magnesium 2.0; Potassium 3.9; Sodium 131; TSH 3.114 03/07/2018: Hemoglobin 12.7; Platelets 198   Wt Readings from Last 3 Encounters:  06/27/18 158 lb (71.7 kg)  03/26/18 149 lb 14.4 oz (68 kg)  03/25/18 152 lb (68.9 kg)     Other studies personally reviewed: Additional studies/ records that were  reviewed today include:   As above  Review of the above records today demonstrates:  Prior radiographs: COPD and good lead position    Remote transmission 4/20 was reviewed.  Normal device function.  Outputs still programmed at implant levels  ASSESSMENT & PLAN:    CHB   Syncope  Hypertension  CHF DOE  Pacemaker-Saint Jude  Smoking   wil need for OV to reprogram outputs  BP remains poorly controlled;  If still at next OV will increase carvedilol from 3>> 6.25 mg bid    COVID 19 screen The patient denies symptoms of COVID 19 at this time.  The importance of social distancing was discussed today.  Follow-up:  2888m  Device clinic    Current medicines are reviewed at length with the patient today.   The patient does not have concerns regarding his medicines.  The following changes were made today:  none  Labs/ tests ordered today include:   No orders of the defined types were placed in this encounter.   Future tests ( post COVID )     Patient Risk:  after full review of this patients clinical status, I feel that they are at moderate  risk at this time.  Today, I have spent 9* minutes with the patient with telehealth technology discussing the above.  Signed, Sherryl MangesSteven , MD  06/27/2018 2:43 PM     [facility] [address] [place] [address] [phone] (office) [phone] (fax)

## 2018-06-28 ENCOUNTER — Encounter: Payer: Self-pay | Admitting: Cardiology

## 2018-06-28 NOTE — Progress Notes (Signed)
Remote pacemaker transmission.   

## 2018-08-16 ENCOUNTER — Telehealth: Payer: Self-pay

## 2018-08-16 NOTE — Telephone Encounter (Signed)
Appointment 08-19-2018 at 11 am     COVID-19 Pre-Screening Questions:  . In the past 7 to 10 days have you had a cough,  shortness of breath, headache, congestion, fever (100 or greater) body aches, chills, sore throat, or sudden loss of taste or sense of smell? . Have you been around anyone with known Covid 19. . Have you been around anyone who is awaiting Covid 19 test results in the past 7 to 10 days? . Have you been around anyone who has been exposed to Covid 19, or has mentioned symptoms of Covid 19 within the past 7 to 10 days?  If you have any concerns/questions about symptoms patients report during screening (either on the phone or at threshold). Contact the provider seeing the patient or DOD for further guidance.  If neither are available contact a member of the leadership team.           Pt wife answered No to all covid-19 questions. I informed her that we are asking that the pt comes alone to the appointment because we are limiting the amount of people coming into the office. Pt wife states she wanted to come to the appointment with the pt. I told her unless the pt needs physical help mobility wise we are requesting that he come alone to the appointment. I also told her if she like she can write down her questions and the questions will be answered or we can have the Dr. Call after his appointment to answer his questions. I let her know if anything changes between now and his appointment to call the office. The pt wife verbalized understanding.

## 2018-08-19 ENCOUNTER — Other Ambulatory Visit: Payer: Self-pay

## 2018-08-19 ENCOUNTER — Ambulatory Visit (INDEPENDENT_AMBULATORY_CARE_PROVIDER_SITE_OTHER): Payer: Medicare HMO | Admitting: *Deleted

## 2018-08-19 DIAGNOSIS — I442 Atrioventricular block, complete: Secondary | ICD-10-CM | POA: Diagnosis not present

## 2018-08-19 LAB — CUP PACEART INCLINIC DEVICE CHECK
Battery Remaining Longevity: 110 mo
Battery Voltage: 3.01 V
Brady Statistic RA Percent Paced: 0.76 %
Brady Statistic RV Percent Paced: 99.98 %
Date Time Interrogation Session: 20200622120250
Implantable Lead Implant Date: 20200108
Implantable Lead Implant Date: 20200108
Implantable Lead Location: 753859
Implantable Lead Location: 753860
Implantable Lead Model: 5076
Implantable Lead Model: 5076
Implantable Pulse Generator Implant Date: 20200108
Lead Channel Impedance Value: 437.5 Ohm
Lead Channel Impedance Value: 512.5 Ohm
Lead Channel Pacing Threshold Amplitude: 0.75 V
Lead Channel Pacing Threshold Amplitude: 1.25 V
Lead Channel Pacing Threshold Pulse Width: 0.4 ms
Lead Channel Pacing Threshold Pulse Width: 0.5 ms
Lead Channel Sensing Intrinsic Amplitude: 5 mV
Lead Channel Sensing Intrinsic Amplitude: 9.2 mV
Lead Channel Setting Pacing Amplitude: 2 V
Lead Channel Setting Pacing Amplitude: 2.5 V
Lead Channel Setting Pacing Pulse Width: 0.4 ms
Lead Channel Setting Sensing Sensitivity: 4 mV
Pulse Gen Model: 2272
Pulse Gen Serial Number: 9091999

## 2018-08-19 NOTE — Progress Notes (Signed)
Pacemaker check in clinic. Normal device function. Thresholds, sensing, impedances consistent with previous measurements. Device programmed to maximize longevity.  2 AMS episodes that are 6 sec in duration that appear to be AT ( AF burden <1%). No high ventricular rates noted.  Device reprogrammed at appropriate safety margins of Atrial at 2  and  2.5 V per Dr Caryl Comes. Histogram distribution appropriate for patient activity level. Device programmed to optimize intrinsic conduction. Estimated longevity 9 yrs 2 mo. Patient enrolled in remote follow-up, next remote f/u 09/19/18 . Patient education completed.

## 2018-09-19 ENCOUNTER — Ambulatory Visit (INDEPENDENT_AMBULATORY_CARE_PROVIDER_SITE_OTHER): Payer: Medicare HMO | Admitting: *Deleted

## 2018-09-19 DIAGNOSIS — I442 Atrioventricular block, complete: Secondary | ICD-10-CM | POA: Diagnosis not present

## 2018-09-19 DIAGNOSIS — I509 Heart failure, unspecified: Secondary | ICD-10-CM

## 2018-09-19 LAB — CUP PACEART REMOTE DEVICE CHECK
Battery Remaining Longevity: 102 mo
Battery Remaining Percentage: 95.5 %
Battery Voltage: 3.01 V
Brady Statistic AP VP Percent: 1.6 %
Brady Statistic AP VS Percent: 1 %
Brady Statistic AS VP Percent: 98 %
Brady Statistic AS VS Percent: 1 %
Brady Statistic RA Percent Paced: 1.6 %
Brady Statistic RV Percent Paced: 99 %
Date Time Interrogation Session: 20200723062626
Implantable Lead Implant Date: 20200108
Implantable Lead Implant Date: 20200108
Implantable Lead Location: 753859
Implantable Lead Location: 753860
Implantable Lead Model: 5076
Implantable Lead Model: 5076
Implantable Pulse Generator Implant Date: 20200108
Lead Channel Impedance Value: 430 Ohm
Lead Channel Impedance Value: 450 Ohm
Lead Channel Sensing Intrinsic Amplitude: 5 mV
Lead Channel Setting Pacing Amplitude: 2 V
Lead Channel Setting Pacing Amplitude: 2.5 V
Lead Channel Setting Pacing Pulse Width: 0.4 ms
Lead Channel Setting Sensing Sensitivity: 4 mV
Pulse Gen Model: 2272
Pulse Gen Serial Number: 9091999

## 2018-10-01 NOTE — Progress Notes (Signed)
Remote pacemaker transmission.   

## 2018-11-22 ENCOUNTER — Other Ambulatory Visit: Payer: Self-pay | Admitting: Physician Assistant

## 2018-11-22 NOTE — Telephone Encounter (Signed)
Pt's pharmacy is requesting a refill on albuterol inhaler. Would Dr. Caryl Comes like to refill this medication? Please address

## 2018-12-20 ENCOUNTER — Ambulatory Visit (INDEPENDENT_AMBULATORY_CARE_PROVIDER_SITE_OTHER): Payer: Medicare HMO | Admitting: *Deleted

## 2018-12-20 DIAGNOSIS — I442 Atrioventricular block, complete: Secondary | ICD-10-CM | POA: Diagnosis not present

## 2018-12-20 DIAGNOSIS — I509 Heart failure, unspecified: Secondary | ICD-10-CM

## 2018-12-22 LAB — CUP PACEART REMOTE DEVICE CHECK
Battery Remaining Longevity: 101 mo
Battery Remaining Percentage: 95.5 %
Battery Voltage: 3.01 V
Brady Statistic AP VP Percent: 1 %
Brady Statistic AP VS Percent: 1 %
Brady Statistic AS VP Percent: 99 %
Brady Statistic AS VS Percent: 1 %
Brady Statistic RA Percent Paced: 1 %
Brady Statistic RV Percent Paced: 99 %
Date Time Interrogation Session: 20201022060014
Implantable Lead Implant Date: 20200108
Implantable Lead Implant Date: 20200108
Implantable Lead Location: 753859
Implantable Lead Location: 753860
Implantable Lead Model: 5076
Implantable Lead Model: 5076
Implantable Pulse Generator Implant Date: 20200108
Lead Channel Impedance Value: 390 Ohm
Lead Channel Impedance Value: 450 Ohm
Lead Channel Pacing Threshold Amplitude: 0.75 V
Lead Channel Pacing Threshold Amplitude: 1.25 V
Lead Channel Pacing Threshold Pulse Width: 0.4 ms
Lead Channel Pacing Threshold Pulse Width: 0.5 ms
Lead Channel Sensing Intrinsic Amplitude: 12 mV
Lead Channel Sensing Intrinsic Amplitude: 5 mV
Lead Channel Setting Pacing Amplitude: 2 V
Lead Channel Setting Pacing Amplitude: 2.5 V
Lead Channel Setting Pacing Pulse Width: 0.4 ms
Lead Channel Setting Sensing Sensitivity: 4 mV
Pulse Gen Model: 2272
Pulse Gen Serial Number: 9091999

## 2018-12-30 NOTE — Progress Notes (Signed)
Remote pacemaker transmission.   

## 2019-02-14 ENCOUNTER — Telehealth: Payer: Self-pay | Admitting: Internal Medicine

## 2019-02-14 NOTE — Telephone Encounter (Signed)
I spoke to the patient's wife Baldo Ash) who called because the patient had experienced a "cold/freezing" experience last night with no fever noted.  Today, he had some CP and has been tired all day.   His BP is 90/50 with HR 90.  I instructed him to take his morning dose of Carvedilol Saturday/Sunday and monitor vitals/symptoms.  They will call us Monday with an update, but know that if symptoms worsen, they should go to the ED. They verbalized understanding.

## 2019-02-14 NOTE — Telephone Encounter (Signed)
Pt c/o BP issue: STAT if pt c/o blurred vision, one-sided weakness or slurred speech  1. What are your last 5 BP readings?  90/50 HR90  2. Are you having any other symptoms (ex. Dizziness, headache, blurred vision, passed out)? Very tired  3. What is your BP issue? Patient's wife called stating the patient was shaking badly last night 02/13/19 and very cold. She took his BP today and it was low.   Patient's wife is concerned and would like a call back.

## 2019-02-15 ENCOUNTER — Other Ambulatory Visit: Payer: Self-pay

## 2019-02-15 ENCOUNTER — Inpatient Hospital Stay (HOSPITAL_COMMUNITY)
Admission: EM | Admit: 2019-02-15 | Discharge: 2019-02-18 | DRG: 871 | Disposition: A | Discharge status: Home / Self Care | Payer: Medicare HMO | Attending: Internal Medicine | Admitting: Internal Medicine

## 2019-02-15 ENCOUNTER — Emergency Department (HOSPITAL_COMMUNITY): Payer: Medicare HMO

## 2019-02-15 ENCOUNTER — Encounter (HOSPITAL_COMMUNITY): Payer: Self-pay | Admitting: Emergency Medicine

## 2019-02-15 DIAGNOSIS — J181 Lobar pneumonia, unspecified organism: Secondary | ICD-10-CM | POA: Diagnosis present

## 2019-02-15 DIAGNOSIS — I4510 Unspecified right bundle-branch block: Secondary | ICD-10-CM | POA: Diagnosis present

## 2019-02-15 DIAGNOSIS — R6520 Severe sepsis without septic shock: Secondary | ICD-10-CM

## 2019-02-15 DIAGNOSIS — E785 Hyperlipidemia, unspecified: Secondary | ICD-10-CM | POA: Diagnosis present

## 2019-02-15 DIAGNOSIS — I442 Atrioventricular block, complete: Secondary | ICD-10-CM | POA: Diagnosis present

## 2019-02-15 DIAGNOSIS — Z91030 Bee allergy status: Secondary | ICD-10-CM

## 2019-02-15 DIAGNOSIS — I248 Other forms of acute ischemic heart disease: Secondary | ICD-10-CM | POA: Diagnosis present

## 2019-02-15 DIAGNOSIS — A419 Sepsis, unspecified organism: Secondary | ICD-10-CM | POA: Diagnosis present

## 2019-02-15 DIAGNOSIS — J9601 Acute respiratory failure with hypoxia: Secondary | ICD-10-CM | POA: Diagnosis present

## 2019-02-15 DIAGNOSIS — Z20828 Contact with and (suspected) exposure to other viral communicable diseases: Secondary | ICD-10-CM | POA: Diagnosis present

## 2019-02-15 DIAGNOSIS — R778 Other specified abnormalities of plasma proteins: Secondary | ICD-10-CM | POA: Diagnosis present

## 2019-02-15 DIAGNOSIS — J440 Chronic obstructive pulmonary disease with acute lower respiratory infection: Secondary | ICD-10-CM | POA: Diagnosis present

## 2019-02-15 DIAGNOSIS — Z79899 Other long term (current) drug therapy: Secondary | ICD-10-CM

## 2019-02-15 DIAGNOSIS — Z7952 Long term (current) use of systemic steroids: Secondary | ICD-10-CM

## 2019-02-15 DIAGNOSIS — N179 Acute kidney failure, unspecified: Secondary | ICD-10-CM | POA: Diagnosis present

## 2019-02-15 DIAGNOSIS — Z950 Presence of cardiac pacemaker: Secondary | ICD-10-CM | POA: Diagnosis not present

## 2019-02-15 DIAGNOSIS — I10 Essential (primary) hypertension: Secondary | ICD-10-CM | POA: Diagnosis present

## 2019-02-15 DIAGNOSIS — F17210 Nicotine dependence, cigarettes, uncomplicated: Secondary | ICD-10-CM | POA: Diagnosis present

## 2019-02-15 DIAGNOSIS — J189 Pneumonia, unspecified organism: Secondary | ICD-10-CM

## 2019-02-15 DIAGNOSIS — J441 Chronic obstructive pulmonary disease with (acute) exacerbation: Secondary | ICD-10-CM | POA: Diagnosis present

## 2019-02-15 HISTORY — DX: Chronic obstructive pulmonary disease, unspecified: J44.9

## 2019-02-15 HISTORY — DX: Essential (primary) hypertension: I10

## 2019-02-15 HISTORY — DX: Pure hypercholesterolemia, unspecified: E78.00

## 2019-02-15 HISTORY — DX: Acute kidney failure, unspecified: N17.9

## 2019-02-15 HISTORY — DX: Sepsis, unspecified organism: A41.9

## 2019-02-15 LAB — TROPONIN I (HIGH SENSITIVITY)
Troponin I (High Sensitivity): 84 ng/L — ABNORMAL HIGH (ref <18)
Troponin I (High Sensitivity): 82 ng/L — ABNORMAL HIGH (ref <18)

## 2019-02-15 LAB — CBC WITH DIFFERENTIAL/PLATELET
Abs Immature Granulocytes: 0.08 10*3/uL — ABNORMAL HIGH (ref 0.00–0.07)
Basophils Absolute: 0.1 10*3/uL (ref 0.0–0.1)
Basophils Relative: 1 %
Eosinophils Absolute: 0 10*3/uL (ref 0.0–0.5)
Eosinophils Relative: 0 %
HCT: 39.8 % (ref 39.0–52.0)
Hemoglobin: 13.5 g/dL (ref 13.0–17.0)
Immature Granulocytes: 0 %
Lymphocytes Relative: 3 %
Lymphs Abs: 0.7 10*3/uL (ref 0.7–4.0)
MCH: 35.7 pg — ABNORMAL HIGH (ref 26.0–34.0)
MCHC: 33.9 g/dL (ref 30.0–36.0)
MCV: 105.3 fL — ABNORMAL HIGH (ref 80.0–100.0)
Monocytes Absolute: 0.5 10*3/uL (ref 0.1–1.0)
Monocytes Relative: 2 %
Neutro Abs: 20.5 10*3/uL — ABNORMAL HIGH (ref 1.7–7.7)
Neutrophils Relative %: 94 %
Platelets: 225 10*3/uL (ref 150–400)
RBC: 3.78 MIL/uL — ABNORMAL LOW (ref 4.22–5.81)
RDW: 12.3 % (ref 11.5–15.5)
WBC: 21.9 10*3/uL — ABNORMAL HIGH (ref 4.0–10.5)
nRBC: 0 % (ref 0.0–0.2)

## 2019-02-15 LAB — COMPREHENSIVE METABOLIC PANEL
ALT: 26 U/L (ref 0–44)
AST: 22 U/L (ref 15–41)
Albumin: 3 g/dL — ABNORMAL LOW (ref 3.5–5.0)
Alkaline Phosphatase: 56 U/L (ref 38–126)
Anion gap: 12 (ref 5–15)
BUN: 29 mg/dL — ABNORMAL HIGH (ref 8–23)
CO2: 22 mmol/L (ref 22–32)
Calcium: 8.8 mg/dL — ABNORMAL LOW (ref 8.9–10.3)
Chloride: 97 mmol/L — ABNORMAL LOW (ref 98–111)
Creatinine, Ser: 2.91 mg/dL — ABNORMAL HIGH (ref 0.61–1.24)
GFR calc Af Amer: 24 mL/min — ABNORMAL LOW (ref >60)
GFR calc non Af Amer: 21 mL/min — ABNORMAL LOW (ref >60)
Glucose, Bld: 133 mg/dL — ABNORMAL HIGH (ref 70–99)
Potassium: 4.5 mmol/L (ref 3.5–5.1)
Sodium: 131 mmol/L — ABNORMAL LOW (ref 135–145)
Total Bilirubin: 0.5 mg/dL (ref 0.3–1.2)
Total Protein: 6.6 g/dL (ref 6.5–8.1)

## 2019-02-15 LAB — RESPIRATORY PANEL BY RT PCR (FLU A&B, COVID)
Influenza A by PCR: NEGATIVE
Influenza B by PCR: NEGATIVE
SARS Coronavirus 2 by RT PCR: NEGATIVE

## 2019-02-15 LAB — BRAIN NATRIURETIC PEPTIDE: B Natriuretic Peptide: 974 pg/mL — ABNORMAL HIGH (ref 0.0–100.0)

## 2019-02-15 LAB — POC SARS CORONAVIRUS 2 AG -  ED: SARS Coronavirus 2 Ag: NEGATIVE

## 2019-02-15 LAB — LACTIC ACID, PLASMA
Lactic Acid, Venous: 3.1 mmol/L (ref 0.5–1.9)
Lactic Acid, Venous: 3 mmol/L (ref 0.5–1.9)

## 2019-02-15 LAB — PROCALCITONIN: Procalcitonin: 30.87 ng/mL

## 2019-02-15 MED ORDER — ENOXAPARIN SODIUM 30 MG/0.3ML ~~LOC~~ SOLN
30.0000 mg | SUBCUTANEOUS | Status: DC
  Administered 2019-02-15: 30 mg via SUBCUTANEOUS
  Filled 2019-02-15: qty 0.3

## 2019-02-15 MED ORDER — METHYLPREDNISOLONE SODIUM SUCC 125 MG IJ SOLR
60.0000 mg | Freq: Four times a day (QID) | INTRAMUSCULAR | Status: DC
  Administered 2019-02-15 – 2019-02-16 (×3): 60 mg via INTRAVENOUS
  Filled 2019-02-15 (×3): qty 2

## 2019-02-15 MED ORDER — SODIUM CHLORIDE 0.9 % IV BOLUS
1000.0000 mL | Freq: Once | INTRAVENOUS | Status: AC
  Administered 2019-02-15: 1000 mL via INTRAVENOUS

## 2019-02-15 MED ORDER — ALBUTEROL SULFATE (2.5 MG/3ML) 0.083% IN NEBU: 3.0000 mL | INHALATION_SOLUTION | RESPIRATORY_TRACT | Status: DC | PRN

## 2019-02-15 MED ORDER — MAGNESIUM SULFATE 2 GM/50ML IV SOLN
2.0000 g | Freq: Once | INTRAVENOUS | Status: AC
  Administered 2019-02-15: 2 g via INTRAVENOUS
  Filled 2019-02-15: qty 50

## 2019-02-15 MED ORDER — FLUTICASONE FUROATE-VILANTEROL 200-25 MCG/INH IN AEPB
1.0000 | INHALATION_SPRAY | Freq: Every day | RESPIRATORY_TRACT | Status: DC
  Filled 2019-02-15: qty 28

## 2019-02-15 MED ORDER — IPRATROPIUM-ALBUTEROL 20-100 MCG/ACT IN AERS
1.0000 | INHALATION_SPRAY | Freq: Four times a day (QID) | RESPIRATORY_TRACT | Status: DC | PRN
  Filled 2019-02-15: qty 4

## 2019-02-15 MED ORDER — LEVOFLOXACIN IN D5W 500 MG/100ML IV SOLN
500.0000 mg | Freq: Once | INTRAVENOUS | Status: AC
  Administered 2019-02-15: 500 mg via INTRAVENOUS
  Filled 2019-02-15: qty 100

## 2019-02-15 MED ORDER — SODIUM CHLORIDE 0.9 % IV SOLN: INTRAVENOUS | Status: DC

## 2019-02-15 MED ORDER — SODIUM CHLORIDE 0.9 % IV SOLN
2.0000 g | INTRAVENOUS | Status: DC
  Administered 2019-02-15 – 2019-02-17 (×3): 2 g via INTRAVENOUS
  Filled 2019-02-15 (×3): qty 20

## 2019-02-15 MED ORDER — PREDNISONE 20 MG PO TABS: 40.0000 mg | ORAL_TABLET | Freq: Every day | ORAL | Status: DC

## 2019-02-15 MED ORDER — METHYLPREDNISOLONE SODIUM SUCC 125 MG IJ SOLR
125.0000 mg | Freq: Once | INTRAMUSCULAR | Status: AC
  Administered 2019-02-15: 125 mg via INTRAVENOUS
  Filled 2019-02-15: qty 2

## 2019-02-15 MED ORDER — AZITHROMYCIN 250 MG PO TABS
500.0000 mg | ORAL_TABLET | Freq: Every day | ORAL | Status: DC
  Administered 2019-02-17 – 2019-02-18 (×2): 500 mg via ORAL
  Filled 2019-02-15 (×2): qty 2

## 2019-02-15 NOTE — H&P (Addendum)
History and Physical  Eric Diaz TML:[id number] DOB: [birth year] DOA: 02/15/2019  Referring physician: Dr Roderic Palau, ED physician PCP: System, Pcp Not In  Outpatient Specialists:   Patient Coming From: home  Chief Complaint: SOB  HPI: Eric Diaz is a 73 y.o. male with a history of COPD, hypertension, hyperlipidemia.  Patient presents with several days of increasing shortness of breath.  Shortness of breath worse with exertion and improved with rest.  Shortness of breath accompanied with aching chest pain that is intermittent and waxing and waning.  Not associated with rest.  No radiation and no diaphoresis.  Does have a cough that is nonproductive.  He had some chills earlier in the week, but is not sure about a fever.  His shortness of breath is worsening.  He does have a history of COPD and is on chronic long-acting bronchodilators with inhaled corticosteroids and is steroid-dependent.  Emergency Department Course: White count elevated to 21 with chest x-ray positive for pneumonia.  His blood pressure was a little low in the beginning at 91/50.  Patient given steroids due to coarse breath sounds and wheezing.  He was also given IV antibiotics.  Blood cultures drawn.  Review of Systems:   Pt denies any nausea, vomiting, diarrhea, constipation, abdominal pain, orthopnea, palpitations, headache, vision changes, lightheadedness, dizziness, melena, rectal bleeding.  Review of systems are otherwise negative  Past Medical History:  Diagnosis Date  . COPD (chronic obstructive pulmonary disease) ([place])   . High cholesterol   . Hypertension    Past Surgical History:  Procedure Laterality Date  . KNEE ARTHROSCOPY     left  . LEAD REVISION/REPAIR N/A 03/25/2018   Procedure: LEAD REVISION/REPAIR;  Surgeon: Deboraha Sprang, MD;  Location: [facility];  Service: Cardiovascular;  Laterality: N/A;  . PACEMAKER IMPLANT N/A 03/06/2018   Procedure: PACEMAKER IMPLANT;  Surgeon: Deboraha Sprang, MD;  Location: [facility];  Service: Cardiovascular;  Laterality: N/A;   Social History:  reports that he has been smoking. He has been smoking about 1.00 pack per day. He has never used smokeless tobacco. He reports current alcohol use. He reports that he does not use drugs. Patient lives at home  Allergies  Allergen Reactions  . Bee Venom Anaphylaxis and Swelling    History reviewed. No pertinent family history.  Family history of COPD  Prior to Admission medications   Medication Sig Start Date End Date Taking? Authorizing Provider  acetaminophen (TYLENOL) 500 MG tablet Take 1,000 mg by mouth 2 (two) times daily.    Yes [provider]  albuterol (VENTOLIN HFA) 108 (90 Base) MCG/ACT inhaler Inhale 1 puff into the lungs every 6 (six) hours as needed for wheezing or shortness of breath. Plz see PCP for additional refills. 11/22/18  Yes Deboraha Sprang, MD  carvedilol (COREG) 12.5 MG tablet Take 12.5 mg by mouth 2 (two) times daily.  03/14/18  Yes [provider]  Cyanocobalamin (B-12 PO) Take 1 tablet by mouth daily.   Yes [provider]  ibuprofen (ADVIL,MOTRIN) 200 MG tablet Take 200 mg by mouth 2 (two) times daily.    Yes [provider]  Multiple Vitamin (MULTIVITAMIN WITH MINERALS) TABS tablet Take 1 tablet by mouth daily.   Yes [provider]  predniSONE (DELTASONE) 10 MG tablet Take 10 mg by mouth daily. 02/07/19  Yes [provider]  Grant Ruts INHUB 250-50 MCG/DOSE AEPB Inhale 1 puff into the lungs daily. 03/16/18  Yes [provider]    Physical Exam: BP (!) 103/50   Pulse 100   Temp 97.6 F (36.4 C) (Oral)   Resp (!) 24   Ht 6' (1.829 m)   Wt 74.8 kg   SpO2 94%   BMI 22.38 kg/m   . General: Elderly male. Awake and alert and oriented x3. No acute cardiopulmonary distress.  Marland Kitchen HEENT: Normocephalic atraumatic.  Right and left ears normal in appearance.  Pupils equal, round, reactive to light.  Extraocular muscles are intact. Sclerae anicteric and noninjected.  Moist mucosal membranes. No mucosal lesions.  . Neck: Neck supple without lymphadenopathy. No carotid bruits. No masses palpated.  . Cardiovascular: Regular rate with normal S1-S2 sounds. No murmurs, rubs, gallops auscultated. No JVD.  Marland Kitchen Respiratory: Expiratory wheezing.  Rales in bases bilaterally.  No accessory muscle use. . Abdomen: Soft, nontender, nondistended. Active bowel sounds. No masses or hepatosplenomegaly  . Skin: No rashes, lesions, or ulcerations.  Dry, warm to touch. 2+ dorsalis pedis and radial pulses. . Musculoskeletal: No calf or leg pain. All major joints not erythematous nontender.  No upper or lower joint deformation.  Good ROM.  No contractures  . Psychiatric: Intact judgment and insight. Pleasant and cooperative. . Neurologic: No focal neurological deficits. Strength is 5/5 and symmetric in upper and lower extremities.  Cranial nerves II through XII are grossly intact.           Labs on Admission: I have personally reviewed following labs and imaging studies  CBC: Recent Labs  Lab 02/15/19 1338  WBC 21.9*  NEUTROABS 20.5*  HGB 13.5  HCT 39.8  MCV 105.3*  PLT 225   Basic Metabolic Panel: Recent Labs  Lab 02/15/19 1338  NA 131*  K 4.5  CL 97*  CO2 22  GLUCOSE 133*  BUN 29*  CREATININE 2.91*  CALCIUM 8.8*   GFR: Estimated Creatinine Clearance: 24.3 mL/min (A) (by C-G formula based on SCr of 2.91 mg/dL (H)). Liver Function Tests: Recent Labs  Lab 02/15/19 1338  AST 22  ALT 26  ALKPHOS 56  BILITOT 0.5  PROT 6.6  ALBUMIN 3.0*   No results for input(s): LIPASE, AMYLASE in the last 168 hours. No results for input(s): AMMONIA in the last 168 hours. Coagulation Profile: No results for input(s): INR, PROTIME in the last 168 hours. Cardiac Enzymes: No results for input(s): CKTOTAL, CKMB, CKMBINDEX, TROPONINI in the last 168 hours. BNP (last 3 results) No results for input(s):  PROBNP in the last 8760 hours. HbA1C: No results for input(s): HGBA1C in the last 72 hours. CBG: No results for input(s): GLUCAP in the last 168 hours. Lipid Profile: No results for input(s): CHOL, HDL, LDLCALC, TRIG, CHOLHDL, LDLDIRECT in the last 72 hours. Thyroid Function Tests: No results for input(s): TSH, T4TOTAL, FREET4, T3FREE, THYROIDAB in the last 72 hours. Anemia Panel: No results for input(s): VITAMINB12, FOLATE, FERRITIN, TIBC, IRON, RETICCTPCT in the last 72 hours. Urine analysis: No results found for: COLORURINE, APPEARANCEUR, LABSPEC, PHURINE, GLUCOSEU, HGBUR, BILIRUBINUR, KETONESUR, PROTEINUR, UROBILINOGEN, NITRITE, LEUKOCYTESUR Sepsis Labs: @LABRCNTIP (procalcitonin:4,lacticidven:4) ) Recent Results (from the past 240 hour(s))  Blood culture (routine x 2)     Status: None (Preliminary result)   Collection Time: 02/15/19  2:37 PM   Specimen: BLOOD LEFT ARM  Result Value Ref Range Status   Specimen Description BLOOD LEFT ARM BOTTLES DRAWN AEROBIC AND ANAEROBIC  Final   Special Requests   Final    Blood Culture adequate volume Performed at [facility], [address] [place], [place] [address]    Culture PENDING  Incomplete   Report Status PENDING  Incomplete  Blood culture (routine x 2)     Status: None (Preliminary result)   Collection Time: 02/15/19  2:47 PM   Specimen: Left Antecubital; Blood  Result Value Ref Range Status   Specimen Description   Final    LEFT ANTECUBITAL BOTTLES DRAWN AEROBIC AND ANAEROBIC   Special Requests   Final    Blood Culture adequate volume Performed at [facility], [address] [place], [place] [address]    Culture PENDING  Incomplete   Report Status PENDING  Incomplete  Respiratory Panel by RT PCR (Flu A&B, Covid) - Nasopharyngeal Swab     Status: None   Collection Time: 02/15/19  3:09 PM   Specimen: Nasopharyngeal Swab  Result Value Ref Range Status   SARS Coronavirus 2 by RT PCR NEGATIVE NEGATIVE Final    Comment:  (NOTE) SARS-CoV-2 target nucleic acids are NOT DETECTED. The SARS-CoV-2 RNA is generally detectable in upper respiratoy specimens during the acute phase of infection. The lowest concentration of SARS-CoV-2 viral copies this assay can detect is 131 copies/mL. A negative result does not preclude SARS-Cov-2 infection and should not be used as the sole basis for treatment or other patient management decisions. A negative result may occur with  improper specimen collection/handling, submission of specimen other than nasopharyngeal swab, presence of viral mutation(s) within the areas targeted by this assay, and inadequate number of viral copies (<131 copies/mL). A negative result must be combined with clinical observations, patient history, and epidemiological information. The expected result is Negative. Fact Sheet for Patients:  https://www.moore.com/https://www.fda.gov/media/142436/download Fact Sheet for Healthcare Providers:  https://www.young.biz/https://www.fda.gov/media/142435/download This test is not yet ap proved or cleared by the [place] FDA and  has been authorized for detection and/or diagnosis of SARS-CoV-2 by FDA under an Emergency Use Authorization (EUA). This EUA will remain  in effect (meaning this test can be used) for the duration of the COVID-19 declaration under Section 564(b)(1) of the Act, 21 U.S.C. section 360bbb-3(b)(1), unless the authorization is terminated or revoked sooner.    Influenza A by PCR NEGATIVE NEGATIVE Final   Influenza B by PCR NEGATIVE NEGATIVE Final    Comment: (NOTE) The Xpert Xpress SARS-CoV-2/FLU/RSV assay is intended as an aid in  the diagnosis of influenza from Nasopharyngeal swab specimens and  should not be used as a sole basis for treatment. Nasal washings and  aspirates are unacceptable for Xpert Xpress SARS-CoV-2/FLU/RSV  testing. Fact Sheet for Patients: https://www.moore.com/https://www.fda.gov/media/142436/download Fact Sheet for Healthcare  Providers: https://www.young.biz/https://www.fda.gov/media/142435/download This test is not yet approved or cleared by the [place] FDA and  has been authorized for detection and/or diagnosis of SARS-CoV-2 by  FDA under an Emergency Use Authorization (EUA). This EUA will remain  in effect (meaning this test can be used) for the duration of the  Covid-19 declaration under Section 564(b)(1) of the Act, 21  U.S.C. section 360bbb-3(b)(1), unless the authorization is  terminated or revoked. Performed at [facility], [address] [place], [place] [address]      Radiological Exams on Admission: DG Chest Portable 1 View  Result Date: 02/15/2019 CLINICAL DATA:  Left-sided chest pain, shortness of breath and history COPD. EXAM: PORTABLE CHEST 1 VIEW COMPARISON:  03/26/2018 FINDINGS: Stable heart size and appearance of dual-chamber pacemaker. New left mid and lower lung infiltrates since the prior chest x-ray. Stable hyperinflation and advanced emphysematous lung disease. No pneumothorax or pleural fluid identified.  IMPRESSION: 1. New left mid and lower lung infiltrates. 2. Stable hyperinflation and advanced emphysematous lung disease. Electronically Signed   By: Irish Lack M.D.   On: 02/15/2019 13:57    EKG: Independently reviewed.  Sinus rhythm with intraventricular conduction delay and left bundle branch block.  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  EKG change from prior.  Assessment/Plan: Principal Problem:   Sepsis (HCC) Active Problems:   Acute exacerbation of chronic obstructive pulmonary disease (COPD) (HCC)   CAP (community acquired pneumonia)   AKI (acute kidney injury) (HCC)   Acute respiratory failure with hypoxia (HCC)   Elevated troponin   Essential hypertension    This patient was discussed with the ED physician, including pertinent vitals, physical exam findings, labs, and imaging.  We also discussed care given by the ED provider.  Sepsis  Qualifies for sepsis with endorgan damage  including acute kidney injury, hypotension, elevated troponin, elevated lactic acid  Blood cultures done  Sepsis is secondary to pneumonia  Repeat lactic acid 3.0. Was receiving second bolus of IVF after rpt lactic acid drawn. Will finish out bolus, start maintenance fluids and recheck lactic acid level in 3 hours.  Acute respiratory failure with hypoxia  Improving after dose of steroids  Oxygen support  Acute exacerbation of COPD Antibiotics: Rocephin and azithromycin DuoNeb's every 6 scheduled with albuterol every 2 when necessary Continue inhaled steroids and LA bronchodilator Solu-Medrol 60 mg IV every 6 hours Mucinex  Community-acquired pneumonia Antibiotics: Rocephin and azithromycin Robitussin Blood cultures drawn in the emergency department Sputum cultures CBC tomorrow Strep antigen by urine Influenza screen, respiratory virus panel Procalcitonin  AKI  Fluid resuscitation  Check creatinine in the morning  Gentle maintenance fluids  Elevated troponin  Recheck troponin  Possibly secondary to sepsis, although does have some EKG changes  Repeat EKG in the morning  Hypertension  Hold antihypertensives  DVT prophylaxis: Lovenox Consultants: None Code Status: Full code Family Communication: None Disposition Plan: Patient should be able to return home following resolution of sepsis and improvement of respiratory status   Levie Heritage, DO

## 2019-02-15 NOTE — ED Triage Notes (Signed)
PT states left sided chest pain intermittently x4 days. PT states cough can reproduce the pain. PT c/o increased SOB on exertion and has COPD and is on chronic prednisone. PT denies any fevers or known covid exposure.

## 2019-02-15 NOTE — ED Notes (Signed)
Date and time results received: 02/15/19 5:52 PM  (use smartphrase ".now" to insert current time)  Test: lactic acid Critical Value: 3.0  Name of Provider Notified: Nehemiah Settle, DO  Orders Received? Or Actions Taken?: na

## 2019-02-15 NOTE — ED Provider Notes (Signed)
[facility] EMERGENCY DEPARTMENT Provider Note   CSN: [id number] Arrival date & time: 02/15/19  1254     History Chief Complaint  Patient presents with  . Chest Pain    Eric Diaz is a 73 y.o. male.  Patient complains of cough shortness of breath chills and chest discomfort  The history is provided by the patient. No language interpreter was used.  Chest Pain Pain location:  Substernal area Pain quality: aching   Pain radiates to:  Does not radiate Pain severity:  Mild Onset quality:  Sudden Timing:  Intermittent Progression:  Waxing and waning Chronicity:  New Associated symptoms: no abdominal pain, no back pain, no cough, no fatigue and no headache        Past Medical History:  Diagnosis Date  . COPD (chronic obstructive pulmonary disease) ([place])   . High cholesterol   . Hypertension     Patient Active Problem List   Diagnosis Date Noted  . Complete heart block ([place])   . SOB (shortness of breath)   . Acute heart failure ([place])   . Heart block 03/05/2018  . 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 KNEE ARTHROSCOPY     left  . LEAD REVISION/REPAIR N/A 03/25/2018   Procedure: LEAD REVISION/REPAIR;  Surgeon: Deboraha Sprang, MD;  Location: [facility];  Service: Cardiovascular;  Laterality: N/A;  . PACEMAKER IMPLANT N/A 03/06/2018   Procedure: PACEMAKER IMPLANT;  Surgeon: Deboraha Sprang, MD;  Location: [facility];  Service: Cardiovascular;  Laterality: N/A;       History reviewed. No pertinent family history.  Social History   Tobacco Use  . Smoking status: Current Every Day Smoker    Packs/day: 1.00  . Smokeless tobacco: Never Used  Substance Use Topics  . Alcohol use: Yes    Comment: occasionally  . Drug use: No    Home Medications Prior to Admission medications   Medication Sig Start Date End Date Taking? Authorizing Provider  acetaminophen (TYLENOL) 500 MG tablet Take  1,000 mg by mouth every 6 (six) hours as needed for mild pain or moderate pain.    [provider]  albuterol (VENTOLIN HFA) 108 (90 Base) MCG/ACT inhaler Inhale 1 puff into the lungs every 6 (six) hours as needed for wheezing or shortness of breath. Plz see PCP for additional refills. 11/22/18   Deboraha Sprang, MD  carvedilol (COREG) 12.5 MG tablet Take 1 tablet by mouth 2 (two) times daily. 03/14/18   [provider]  Searcy PAK 1 MG tablet  03/21/18   [provider]  Cyanocobalamin (B-12 PO) Take 1 tablet by mouth daily.    [provider]  ibuprofen (ADVIL,MOTRIN) 200 MG tablet Take 400 mg by mouth every 6 (six) hours as needed for mild pain or moderate pain.    [provider]  Multiple Vitamin (MULTIVITAMIN WITH MINERALS) TABS tablet Take 1 tablet by mouth daily.    [provider]  Grant Ruts INHUB 250-50 MCG/DOSE AEPB Inhale 1 puff into the lungs daily. 03/16/18   [provider]    Allergies    Bee venom  Review of Systems   Review of Systems  Constitutional: Negative for appetite change and fatigue.  HENT: Negative for congestion, ear discharge and sinus pressure.   Eyes: Negative for discharge.  Respiratory: Negative for cough.   Cardiovascular: Positive for chest pain.  Gastrointestinal: Negative  for abdominal pain and diarrhea.  Genitourinary: Negative for frequency and hematuria.  Musculoskeletal: Negative for back pain.  Skin: Negative for rash.  Neurological: Negative for seizures and headaches.  Psychiatric/Behavioral: Negative for hallucinations.    Physical Exam Updated Vital Signs BP (!) 96/42   Pulse 97   Temp 97.6 F (36.4 C) (Oral)   Resp (!) 26   Ht 6' (1.829 m)   Wt 74.8 kg   SpO2 92%   BMI 22.38 kg/m   Physical Exam Vitals and nursing note reviewed.  Constitutional:      Appearance: Normal appearance. He is well-developed.  HENT:     Head: Normocephalic.     Nose: Nose  normal.  Eyes:     General: No scleral icterus.    Conjunctiva/sclera: Conjunctivae normal.  Neck:     Thyroid: No thyromegaly.  Cardiovascular:     Rate and Rhythm: Normal rate and regular rhythm.     Heart sounds: No murmur. No friction rub. No gallop.   Pulmonary:     Breath sounds: No stridor. No wheezing or rales.  Chest:     Chest wall: No tenderness.  Abdominal:     General: There is no distension.     Tenderness: There is no abdominal tenderness. There is no rebound.  Musculoskeletal:        General: Normal range of motion.     Cervical back: Neck supple.  Lymphadenopathy:     Cervical: No cervical adenopathy.  Skin:    Findings: No erythema or rash.  Neurological:     Mental Status: He is alert and oriented to person, place, and time.     Motor: No abnormal muscle tone.     Coordination: Coordination normal.  Psychiatric:        Behavior: Behavior normal.     ED Results / Procedures / Treatments   Labs (all labs ordered are listed, but only abnormal results are displayed) Labs Reviewed  CBC WITH DIFFERENTIAL/PLATELET - Abnormal; Notable for the following components:      Result Value   WBC 21.9 (*)    RBC 3.78 (*)    MCV 105.3 (*)    MCH 35.7 (*)    Neutro Abs 20.5 (*)    Abs Immature Granulocytes 0.08 (*)    All other components within normal limits  COMPREHENSIVE METABOLIC PANEL - Abnormal; Notable for the following components:   Sodium 131 (*)    Chloride 97 (*)    Glucose, Bld 133 (*)    BUN 29 (*)    Creatinine, Ser 2.91 (*)    Calcium 8.8 (*)    Albumin 3.0 (*)    GFR calc non Af Amer 21 (*)    GFR calc Af Amer 24 (*)    All other components within normal limits  BRAIN NATRIURETIC PEPTIDE - Abnormal; Notable for the following components:   B Natriuretic Peptide 974.0 (*)    All other components within normal limits  TROPONIN I (HIGH SENSITIVITY) - Abnormal; Notable for the following components:   Troponin I (High Sensitivity) 84 (*)    All  other components within normal limits  CULTURE, BLOOD (ROUTINE X 2)  CULTURE, BLOOD (ROUTINE X 2)  LACTIC ACID, PLASMA  POC SARS CORONAVIRUS 2 AG -  ED    EKG EKG Interpretation  Date/Time:  Saturday February 15 2019 13:17:23 EST Ventricular Rate:  104 PR Interval:    QRS Duration: 147 QT Interval:  385 QTC Calculation: 504  R Axis:   -80 Text Interpretation: Sinus tachycardia IVCD, consider atypical RBBB LVH with secondary repolarization abnormality Inferior infarct, acute Anterior infarct, old Confirmed by Bethann Berkshire [phone]) on 02/15/2019 1:24:23 PM   Radiology DG Chest Portable 1 View  Result Date: 02/15/2019 CLINICAL DATA:  Left-sided chest pain, shortness of breath and history COPD. EXAM: PORTABLE CHEST 1 VIEW COMPARISON:  03/26/2018 FINDINGS: Stable heart size and appearance of dual-chamber pacemaker. New left mid and lower lung infiltrates since the prior chest x-ray. Stable hyperinflation and advanced emphysematous lung disease. No pneumothorax or pleural fluid identified. IMPRESSION: 1. New left mid and lower lung infiltrates. 2. Stable hyperinflation and advanced emphysematous lung disease. Electronically Signed   By: Irish Lack M.D.   On: 02/15/2019 13:57    Procedures Procedures (including critical care time)  Medications Ordered in ED Medications  magnesium sulfate IVPB 2 g 50 mL (2 g Intravenous New Bag/Given 02/15/19 1427)  levofloxacin (LEVAQUIN) IVPB 500 mg (has no administration in time range)  sodium chloride 0.9 % bolus 1,000 mL (has no administration in time range)  methylPREDNISolone sodium succinate (SOLU-MEDROL) 125 mg/2 mL injection 125 mg (125 mg Intravenous Given 02/15/19 1425)    ED Course  I have reviewed the triage vital signs and the nursing notes.  Pertinent labs & imaging results that were available during my care of the patient were reviewed by me and considered in my medical decision making (see chart for details).    CRITICAL  CARE Performed by: Bethann Berkshire Total critical care time: 45 minutes Critical care time was exclusive of separately billable procedures and treating other patients. Critical care was necessary to treat or prevent imminent or life-threatening deterioration. Critical care was time spent personally by me on the following activities: development of treatment plan with patient and/or surrogate as well as nursing, discussions with consultants, evaluation of patient's response to treatment, examination of patient, obtaining history from patient or surrogate, ordering and performing treatments and interventions, ordering and review of laboratory studies, ordering and review of radiographic studies, pulse oximetry and re-evaluation of patient's condition.  MDM Rules/Calculators/A&P                       Final Clinical Impression(s) / ED Diagnoses Final diagnoses:  None    Rx / DC Orders ED Discharge Orders    None       Bethann Berkshire, MD 02/19/19 [phone]

## 2019-02-16 DIAGNOSIS — A419 Sepsis, unspecified organism: Secondary | ICD-10-CM

## 2019-02-16 LAB — RESPIRATORY PANEL BY PCR

## 2019-02-16 LAB — CBC
HCT: 35.6 % — ABNORMAL LOW (ref 39.0–52.0)
Hemoglobin: 12.2 g/dL — ABNORMAL LOW (ref 13.0–17.0)
MCH: 35.9 pg — ABNORMAL HIGH (ref 26.0–34.0)
MCHC: 34.3 g/dL (ref 30.0–36.0)
MCV: 104.7 fL — ABNORMAL HIGH (ref 80.0–100.0)
Platelets: 209 10*3/uL (ref 150–400)
RBC: 3.4 MIL/uL — ABNORMAL LOW (ref 4.22–5.81)
RDW: 12.2 % (ref 11.5–15.5)
WBC: 21.7 10*3/uL — ABNORMAL HIGH (ref 4.0–10.5)
nRBC: 0 % (ref 0.0–0.2)

## 2019-02-16 LAB — LACTIC ACID, PLASMA
Lactic Acid, Venous: 3.3 mmol/L (ref 0.5–1.9)
Lactic Acid, Venous: 3.1 mmol/L (ref 0.5–1.9)
Lactic Acid, Venous: 2.3 mmol/L (ref 0.5–1.9)

## 2019-02-16 LAB — BASIC METABOLIC PANEL
Anion gap: 9 (ref 5–15)
BUN: 22 mg/dL (ref 8–23)
CO2: 23 mmol/L (ref 22–32)
Calcium: 8.5 mg/dL — ABNORMAL LOW (ref 8.9–10.3)
Chloride: 104 mmol/L (ref 98–111)
Creatinine, Ser: 1.4 mg/dL — ABNORMAL HIGH (ref 0.61–1.24)
GFR calc Af Amer: 58 mL/min — ABNORMAL LOW (ref >60)
GFR calc non Af Amer: 50 mL/min — ABNORMAL LOW (ref >60)
Glucose, Bld: 137 mg/dL — ABNORMAL HIGH (ref 70–99)
Potassium: 4.2 mmol/L (ref 3.5–5.1)
Sodium: 136 mmol/L (ref 135–145)

## 2019-02-16 LAB — MRSA PCR SCREENING: MRSA by PCR: NEGATIVE

## 2019-02-16 LAB — HIV ANTIBODY (ROUTINE TESTING W REFLEX): HIV Screen 4th Generation wRfx: NONREACTIVE

## 2019-02-16 LAB — PROCALCITONIN: Procalcitonin: 15.52 ng/mL

## 2019-02-16 MED ORDER — ONDANSETRON HCL 4 MG/2ML IJ SOLN: 4.0000 mg | Freq: Four times a day (QID) | INTRAMUSCULAR | Status: DC | PRN

## 2019-02-16 MED ORDER — IPRATROPIUM-ALBUTEROL 0.5-2.5 (3) MG/3ML IN SOLN: 3.0000 mL | Freq: Four times a day (QID) | RESPIRATORY_TRACT | Status: DC | PRN

## 2019-02-16 MED ORDER — ACETAMINOPHEN 325 MG PO TABS: 650.0000 mg | ORAL_TABLET | Freq: Four times a day (QID) | ORAL | Status: DC | PRN

## 2019-02-16 MED ORDER — METHYLPREDNISOLONE SODIUM SUCC 125 MG IJ SOLR
60.0000 mg | Freq: Four times a day (QID) | INTRAMUSCULAR | Status: DC
  Administered 2019-02-16 – 2019-02-18 (×8): 60 mg via INTRAVENOUS
  Filled 2019-02-16 (×8): qty 2

## 2019-02-16 MED ORDER — CARVEDILOL 3.125 MG PO TABS
3.1250 mg | ORAL_TABLET | Freq: Two times a day (BID) | ORAL | Status: DC
  Administered 2019-02-16 – 2019-02-17 (×3): 3.125 mg via ORAL
  Filled 2019-02-16 (×3): qty 1

## 2019-02-16 MED ORDER — SODIUM CHLORIDE 0.9 % IV BOLUS
1000.0000 mL | Freq: Once | INTRAVENOUS | Status: AC
  Administered 2019-02-16: 1000 mL via INTRAVENOUS

## 2019-02-16 MED ORDER — ENOXAPARIN SODIUM 40 MG/0.4ML ~~LOC~~ SOLN
40.0000 mg | SUBCUTANEOUS | Status: DC
  Administered 2019-02-16 – 2019-02-17 (×2): 40 mg via SUBCUTANEOUS
  Filled 2019-02-16 (×2): qty 0.4

## 2019-02-16 MED ORDER — SODIUM CHLORIDE 0.9 % IV BOLUS
500.0000 mL | Freq: Once | INTRAVENOUS | Status: AC
  Administered 2019-02-16 (×2): 500 mL via INTRAVENOUS

## 2019-02-16 MED ORDER — IPRATROPIUM-ALBUTEROL 0.5-2.5 (3) MG/3ML IN SOLN
3.0000 mL | Freq: Four times a day (QID) | RESPIRATORY_TRACT | Status: DC
  Administered 2019-02-16 – 2019-02-18 (×9): 3 mL via RESPIRATORY_TRACT
  Filled 2019-02-16 (×9): qty 3

## 2019-02-16 MED ORDER — BUDESONIDE 0.5 MG/2ML IN SUSP
0.5000 mg | Freq: Two times a day (BID) | RESPIRATORY_TRACT | Status: DC
  Administered 2019-02-16 – 2019-02-18 (×5): 0.5 mg via RESPIRATORY_TRACT
  Filled 2019-02-16 (×5): qty 2

## 2019-02-16 NOTE — Progress Notes (Addendum)
CRITICAL VALUE ALERT  Critical Value:  Lactic Acid 3.3  Date & Time Notied:  Lab on 01/2019 0025   Provider Notified: X. Blount on 02/16/2019 0035  Orders Received/Actions taken: Normal saline 0.9% 528mL bolus

## 2019-02-16 NOTE — Progress Notes (Signed)
PROGRESS NOTE  Eric Diaz T Lollar RUE:[id number] DOB: March 30, 1945 DOA: 02/15/2019 PCP: System, Pcp Not In  Brief History:  73 year old male with a history of steroid-dependent COPD, hypertension, hyperlipidemia, third-degree AV block status post PPM presenting with 4-day history of intermittent chest discomfort, coughing, and dyspnea on exertion.  The patient has had some subjective fevers and chills.  He denied any nausea, vomiting, diarrhea, abdominal pain, dysuria, hematuria, hematochezia, melena.  He denies any sick or Covid contacts.  Because of worsening shortness of breath, he presented for further evaluation and treatment.  He denies any new medications.  Upon presentation, the patient was afebrile with soft blood pressures with systolic BP in the 90s.  He was fluid resuscitated with improvement of his blood pressure.  Lactic acid peaked at 3.3.  BMP showed a serum creatinine of 2.91 but was otherwise unremarkable.  LFTs were unremarkable.  WBC was 21.9 with hemoglobin 13.5, platelets 225,000.  Procalcitonin was 30.87.  Chest x-ray showed LLL, LUL opacities.  The patient was started on ceftriaxone and azithromycin.  He received a dose of levofloxacin in the emergency department.  Assessment/Plan: Sepsis with organ dysfunction -Present on admission -Secondary to pneumonia -Lactic acid peaked at 3.3 -Procalcitonin 30.87 -Continue ceftriaxone and azithromycin -Follow blood cultures -continue IVF  Lobar pneumonia -Personally reviewed chest x-ray-LLL, LUL opacities -Continue ceftriaxone and azithromycin -Follow blood cultures  COPD exacerbation -since SARS-CoV2 is neg- start pulmicort and duonebs -Continue IV Solu-Medrol  AKI -due to sepsis -baseline creatinine 0.7-0.9 -serum creatinine peaked 2.91 -am BMP  Elevated troponin -Secondary to demand ischemia in the setting of sepsis -Personally reviewed EKG--sinus tachycardia, RBBB  Essential hypertension -Restart  carvedilol at lower dose secondary to soft blood pressures  Tobacco abuse I have discussed tobacco cessation with the patient.  I have counseled the patient regarding the negative impacts of continued tobacco use including but not limited to lung cancer, COPD, and cardiovascular disease.  I have discussed alternatives to tobacco and modalities that may help facilitate tobacco cessation including but not limited to biofeedback, hypnosis, and medications.  Total time spent with tobacco counseling was 4 minutes.      Disposition Plan:   Home in 2-3 days  Family Communication:   Left VM for daughter 12/20  Consultants:  none  Code Status:  FULL   DVT Prophylaxis:   Lovenox   Procedures: As Listed in Progress Note Above  Antibiotics: Levofloxacin 12/19 Ceftriaxone 12/19>>> azithro 12/19>>>       Subjective: Patient remains short of breath with mild exertion.  He continues to have a nonproductive cough.  He denies fevers, chills, chest pain, nausea, vomiting, diarrhea, abdominal pain, hematochezia, melena.  He states his breathing a little bit better.  Objective: Vitals:   02/15/19 2150 02/15/19 2154 02/15/19 2300 02/16/19 0520  BP: (!) 152/60   132/67  Pulse: (!) 115   (!) 104  Resp: 18   18  Temp: 98.3 F (36.8 C)   98.3 F (36.8 C)  TempSrc: Oral   Oral  SpO2: 96% 96%  96%  Weight:   71.9 kg   Height:   6' (1.829 m)     Intake/Output Summary (Last 24 hours) at 02/16/2019 0931 Last data filed at 02/16/2019 0300 Gross per 24 hour  Intake 4037.5 ml  Output 850 ml  Net 3187.5 ml   Weight change:  Exam:   General:  Pt is alert, follows commands appropriately, not in acute distress  HEENT: No icterus, No thrush, No neck mass, [place]/AT  Cardiovascular: RRR, S1/S2, no rubs, no gallops  Respiratory: Diminished breath sounds bilateral.  Bibasilar expiratory wheeze.  Scattered rales, left greater than right.  Abdomen: Soft/+BS, non tender, non distended, no  guarding  Extremities: No edema, No lymphangitis, No petechiae, No rashes, no synovitis   Data Reviewed: I have personally reviewed following labs and imaging studies Basic Metabolic Panel: Recent Labs  Lab 02/15/19 1338 02/16/19 0200  NA 131* 136  K 4.5 4.2  CL 97* 104  CO2 22 23  GLUCOSE 133* 137*  BUN 29* 22  CREATININE 2.91* 1.40*  CALCIUM 8.8* 8.5*   Liver Function Tests: Recent Labs  Lab 02/15/19 1338  AST 22  ALT 26  ALKPHOS 56  BILITOT 0.5  PROT 6.6  ALBUMIN 3.0*   No results for input(s): LIPASE, AMYLASE in the last 168 hours. No results for input(s): AMMONIA in the last 168 hours. Coagulation Profile: No results for input(s): INR, PROTIME in the last 168 hours. CBC: Recent Labs  Lab 02/15/19 1338 02/16/19 0200  WBC 21.9* 21.7*  NEUTROABS 20.5*  --   HGB 13.5 12.2*  HCT 39.8 35.6*  MCV 105.3* 104.7*  PLT 225 209   Cardiac Enzymes: No results for input(s): CKTOTAL, CKMB, CKMBINDEX, TROPONINI in the last 168 hours. BNP: Invalid input(s): POCBNP CBG: No results for input(s): GLUCAP in the last 168 hours. HbA1C: No results for input(s): HGBA1C in the last 72 hours. Urine analysis: No results found for: COLORURINE, APPEARANCEUR, LABSPEC, PHURINE, GLUCOSEU, HGBUR, BILIRUBINUR, KETONESUR, [place], UROBILINOGEN, NITRITE, LEUKOCYTESUR Sepsis Labs: @LABRCNTIP (procalcitonin:4,lacticidven:4) ) Recent Results (from the past 240 hour(s))  Blood culture (routine x 2)     Status: None (Preliminary result)   Collection Time: 02/15/19  2:37 PM   Specimen: BLOOD LEFT ARM  Result Value Ref Range Status   Specimen Description BLOOD LEFT ARM BOTTLES DRAWN AEROBIC AND ANAEROBIC  Final   Special Requests Blood Culture adequate volume  Final   Culture   Final    NO GROWTH < 24 HOURS Performed at [facility], [address], [place], [place] [address]    Report Status PENDING  Incomplete  Blood culture (routine x 2)     Status: None (Preliminary result)    Collection Time: 02/15/19  2:47 PM   Specimen: Left Antecubital; Blood  Result Value Ref Range Status   Specimen Description   Final    LEFT ANTECUBITAL BOTTLES DRAWN AEROBIC AND ANAEROBIC   Special Requests Blood Culture adequate volume  Final   Culture   Final    NO GROWTH < 24 HOURS Performed at [facility], [address] [place], [place] [address]    Report Status PENDING  Incomplete  Respiratory Panel by RT PCR (Flu A&B, Covid) - Nasopharyngeal Swab     Status: None   Collection Time: 02/15/19  3:09 PM   Specimen: Nasopharyngeal Swab  Result Value Ref Range Status   SARS Coronavirus 2 by RT PCR NEGATIVE NEGATIVE Final    Comment: (NOTE) SARS-CoV-2 target nucleic acids are NOT DETECTED. The SARS-CoV-2 RNA is generally detectable in upper respiratoy specimens during the acute phase of infection. The lowest concentration of SARS-CoV-2 viral copies this assay can detect is 131 copies/mL. A negative result does not preclude SARS-Cov-2 infection and should not be used as the sole basis for treatment or other patient management decisions. A negative result may occur with  improper specimen collection/handling, submission of specimen other than nasopharyngeal  swab, presence of viral mutation(s) within the areas targeted by this assay, and inadequate number of viral copies (<131 copies/mL). A negative result must be combined with clinical observations, patient history, and epidemiological information. The expected result is Negative. Fact Sheet for Patients:  https://www.moore.com/ Fact Sheet for Healthcare Providers:  https://www.young.biz/ This test is not yet ap proved or cleared by the [place] FDA and  has been authorized for detection and/or diagnosis of SARS-CoV-2 by FDA under an Emergency Use Authorization (EUA). This EUA will remain  in effect (meaning this test can be used) for the duration of the COVID-19 declaration under  Section 564(b)(1) of the Act, 21 U.S.C. section 360bbb-3(b)(1), unless the authorization is terminated or revoked sooner.    Influenza A by PCR NEGATIVE NEGATIVE Final   Influenza B by PCR NEGATIVE NEGATIVE Final    Comment: (NOTE) The Xpert Xpress SARS-CoV-2/FLU/RSV assay is intended as an aid in  the diagnosis of influenza from Nasopharyngeal swab specimens and  should not be used as a sole basis for treatment. Nasal washings and  aspirates are unacceptable for Xpert Xpress SARS-CoV-2/FLU/RSV  testing. Fact Sheet for Patients: https://www.moore.com/ Fact Sheet for Healthcare Providers: https://www.young.biz/ This test is not yet approved or cleared by the [place] FDA and  has been authorized for detection and/or diagnosis of SARS-CoV-2 by  FDA under an Emergency Use Authorization (EUA). This EUA will remain  in effect (meaning this test can be used) for the duration of the  Covid-19 declaration under Section 564(b)(1) of the Act, 21  U.S.C. section 360bbb-3(b)(1), unless the authorization is  terminated or revoked. Performed at [facility], [address], [place], [place] [address]      Scheduled Meds: . azithromycin  500 mg Oral Daily  . budesonide (PULMICORT) nebulizer solution  0.5 mg Nebulization BID  . carvedilol  3.125 mg Oral BID WC  . enoxaparin (LOVENOX) injection  30 mg Subcutaneous Q24H  . ipratropium-albuterol  3 mL Nebulization Q6H  . methylPREDNISolone (SOLU-MEDROL) injection  60 mg Intravenous Q6H   Continuous Infusions: . sodium chloride 75 mL/hr at 02/15/19 1854  . cefTRIAXone (ROCEPHIN)  IV 2 g (02/15/19 2302)    Procedures/Studies: DG Chest Portable 1 View  Result Date: 02/15/2019 CLINICAL DATA:  Left-sided chest pain, shortness of breath and history COPD. EXAM: PORTABLE CHEST 1 VIEW COMPARISON:  03/26/2018 FINDINGS: Stable heart size and appearance of dual-chamber pacemaker. New left mid and lower lung  infiltrates since the prior chest x-ray. Stable hyperinflation and advanced emphysematous lung disease. No pneumothorax or pleural fluid identified. IMPRESSION: 1. New left mid and lower lung infiltrates. 2. Stable hyperinflation and advanced emphysematous lung disease. Electronically Signed   By: Irish Lack M.D.   On: 02/15/2019 13:57    Catarina Hartshorn, DO  [facility] Pager [phone]  If 7PM-7AM, please contact night-coverage [email or web address] Password [email or web address] 02/16/2019, 9:31 AM   LOS: 1 day

## 2019-02-16 NOTE — Progress Notes (Signed)
CRITICAL VALUE ALERT  Critical Value:  Lactic acid 3.1  Date & Time Notied:  02/16/2019 0235  Provider Notified: 02/16/2019 0242  Orders Received/Actions taken: Verbal order to give 567mL 9% Normal saline bolus and recheck lactic acid at 0600.

## 2019-02-16 NOTE — Progress Notes (Signed)
CRITICAL VALUE ALERT  Critical Value:  Lactic acid 2.3  Date & Time Notied:  02/16/2019 6116  Provider Notified: Dr. Carles Collet  Orders Received/Actions taken: awaiting call back/ orders

## 2019-02-16 NOTE — Progress Notes (Addendum)
Instructed patient on proper usage, purpose and frequency of usage of IS.  Patient's was able to make it up to 500 ml for first try. Had patient do 10.  Put goal at 1000 to start.  IS at bedside.  Will continue to monitor.

## 2019-02-17 DIAGNOSIS — J181 Lobar pneumonia, unspecified organism: Secondary | ICD-10-CM

## 2019-02-17 LAB — BASIC METABOLIC PANEL
Anion gap: 10 (ref 5–15)
BUN: 16 mg/dL (ref 8–23)
CO2: 23 mmol/L (ref 22–32)
Calcium: 8.5 mg/dL — ABNORMAL LOW (ref 8.9–10.3)
Chloride: 102 mmol/L (ref 98–111)
Creatinine, Ser: 0.8 mg/dL (ref 0.61–1.24)
GFR calc Af Amer: >60 mL/min (ref >60)
GFR calc non Af Amer: >60 mL/min (ref >60)
Glucose, Bld: 121 mg/dL — ABNORMAL HIGH (ref 70–99)
Potassium: 3.8 mmol/L (ref 3.5–5.1)
Sodium: 135 mmol/L (ref 135–145)

## 2019-02-17 LAB — CBC
HCT: 29.1 % — ABNORMAL LOW (ref 39.0–52.0)
Hemoglobin: 10.1 g/dL — ABNORMAL LOW (ref 13.0–17.0)
MCH: 35.6 pg — ABNORMAL HIGH (ref 26.0–34.0)
MCHC: 34.7 g/dL (ref 30.0–36.0)
MCV: 102.5 fL — ABNORMAL HIGH (ref 80.0–100.0)
Platelets: 173 10*3/uL (ref 150–400)
RBC: 2.84 MIL/uL — ABNORMAL LOW (ref 4.22–5.81)
RDW: 12.2 % (ref 11.5–15.5)
WBC: 14.8 10*3/uL — ABNORMAL HIGH (ref 4.0–10.5)
nRBC: 0 % (ref 0.0–0.2)

## 2019-02-17 LAB — MAGNESIUM: Magnesium: 1.8 mg/dL (ref 1.7–2.4)

## 2019-02-17 LAB — PROCALCITONIN: Procalcitonin: 7.11 ng/mL

## 2019-02-17 MED ORDER — ENSURE ENLIVE PO LIQD
237.0000 mL | Freq: Two times a day (BID) | ORAL | Status: DC
  Administered 2019-02-17 – 2019-02-18 (×2): 237 mL via ORAL

## 2019-02-17 MED ORDER — CARVEDILOL 12.5 MG PO TABS
12.5000 mg | ORAL_TABLET | Freq: Two times a day (BID) | ORAL | Status: DC
  Administered 2019-02-17 – 2019-02-18 (×2): 12.5 mg via ORAL
  Filled 2019-02-17 (×2): qty 1

## 2019-02-17 NOTE — Progress Notes (Signed)
Initial Nutrition Assessment  DOCUMENTATION CODES:   Not applicable  INTERVENTION:  -Ensure Enlive po BID, each supplement provides 350 kcal and 20 grams of protein  -Encourage po intake  -Educate on small frequent meals   NUTRITION DIAGNOSIS:   Inadequate oral intake related to acute illness, chronic illness(PNA; COPD) as evidenced by per patient/family report, energy intake < 75% for > 7 days, mild fat depletion, mild muscle depletion.    GOAL:   Patient will meet greater than or equal to 90% of their needs    MONITOR:   PO intake, Supplement acceptance, Weight trends, I & O's, Labs  REASON FOR ASSESSMENT:   Consult Assessment of nutrition requirement/status  ASSESSMENT:  73 year old male with history of COPD on chronic long-acting bronchodilators with inhaled corticosteroids and is steroid dependent,  HTN, HLD who presents with several days of increasing SOB on exertion associated with intermittent waxing and waning chest pain. In ED elevated white count and CXR positive for pneumonia  Met with patient at bedside this morning and he reports feeling pretty good today. He recalls scrambled eggs with bacon this morning for breakfast. Patient endorses recent decreased appetite/intake over the past week secondary to not feeling well and reports usual intake 2-3 meals/day. He recalls a small breakfast of 1 egg and maybe a piece of toast, occasionally eating a sandwich for lunch and reports assorted dinners of steak, chicken, mashed potatoes, with a vegetable or two.   Patient endorses some wt loss s/p having a pacemaker placed in January and recalls UBW 175 lbs. RD educated on increased needs of COPD patients, encouraged small frequent meals throughout the day and recommended incorporating daily nutrition supplement, patient amenable to chocolate or strawberry during admission.   Current wt 71.9 kg (158.2 lbs) Per chart, weights slowly trending up since January s/p weight loss  in the beginning of the year. On 1/07 pt wt 73.5 kg, 1/27 pt wt 68.9 kg, 1/28 pt wt 68 kg, 4/30 pt wt 71.7 kg   Medications reviewed and include: Zithromax 500 mg daily, Coreg 3.125 mg BID, Methylprednisolone IV 60 mg every 6 hrs Rocephin 2 g every 24 hrs Labs: WBC 14.8 (H), Hgb 10.1 (L)  NUTRITION - FOCUSED PHYSICAL EXAM:    Most Recent Value  Orbital Region  Mild depletion  Upper Arm Region  Mild depletion  Thoracic and Lumbar Region  No depletion  Buccal Region  Unable to assess [pt kept mask on during visit]  Temple Region  Mild depletion  Clavicle Bone Region  Mild depletion  Clavicle and Acromion Bone Region  Mild depletion  Scapular Bone Region  No depletion  Dorsal Hand  Moderate depletion  Patellar Region  Moderate depletion  Anterior Thigh Region  Unable to assess [pt wearing sweat pants from home]  Posterior Calf Region  Moderate depletion  Edema (RD Assessment)  None  Hair  Reviewed  Eyes  Reviewed  Mouth  Unable to assess  Skin  Reviewed  Nails  Reviewed       Diet Order:   Diet Order            Diet regular Room service appropriate? Yes; Fluid consistency: Thin  Diet effective now              EDUCATION NEEDS:   Education needs have been addressed  Skin:  Skin Assessment: Reviewed RN Assessment  Last BM:  12/21  Height:   Ht Readings from Last 1 Encounters:  02/15/19 6' (1.829  m)    Weight:   Wt Readings from Last 1 Encounters:  02/15/19 71.9 kg    Ideal Body Weight:  80.9 kg  BMI:  Body mass index is 21.5 kg/m.  Estimated Nutritional Needs:   Kcal:  1900-2100  Protein:  95-105  Fluid:  >/= 1.9 L/day    Lajuan Lines, RD, LDN Clinical Nutrition  Office [phone] After Hours/Weekend Pager: [phone]

## 2019-02-17 NOTE — Progress Notes (Signed)
OT Screen Note  Patient Details Name: Eric Diaz MRN: [id number] DOB: 05-28-1945   Cancelled Treatment:    Reason Eval/Treat Not Completed: OT screened, no needs identified, will sign off. Spoke with patient this AM. Patient reports that he is functioning at his baseline and is independent with all ADL tasks. He states that his breathing is his most limiting factor at this time although it is something he has been dealing with for some time. He lives with his wife and grandson and does not voice any concern about returning home. No DME used. At this time, patient does not require any additional OT services. Thank you for this referral.     Ailene Ravel, OTR/L,CBIS  [phone]  02/17/2019, 9:14 AM

## 2019-02-17 NOTE — Care Management Important Message (Signed)
Important Message  Patient Details  Name: Eric Diaz MRN: [id number] Date of Birth: 1945-07-18   Medicare Important Message Given:  Charlyne Quale, RN to deliver to patient room)     Tommy Medal 02/17/2019, 3:17 PM

## 2019-02-17 NOTE — Progress Notes (Signed)
PROGRESS NOTE  Eric Diaz WRU:[id number] DOB: 1946-01-10 DOA: 02/15/2019 PCP: System, Pcp Not In  Brief History:  73 year old male with a history of steroid-dependent COPD, hypertension, hyperlipidemia, third-degree AV block status post PPM presenting with 4-day history of intermittent chest discomfort, coughing, and dyspnea on exertion.  The patient has had some subjective fevers and chills.  He denied any nausea, vomiting, diarrhea, abdominal pain, dysuria, hematuria, hematochezia, melena.  He denies any sick or Covid contacts.  Because of worsening shortness of breath, he presented for further evaluation and treatment.  He denies any new medications.  Upon presentation, the patient was afebrile with soft blood pressures with systolic BP in the 90s.  He was fluid resuscitated with improvement of his blood pressure.  Lactic acid peaked at 3.3.  BMP showed a serum creatinine of 2.91 but was otherwise unremarkable.  LFTs were unremarkable.  WBC was 21.9 with hemoglobin 13.5, platelets 225,000.  Procalcitonin was 30.87.  Chest x-ray showed LLL, LUL opacities.  The patient was started on ceftriaxone and azithromycin.  He received a dose of levofloxacin in the emergency department.  Assessment/Plan: Sepsis with organ dysfunction -Present on admission -Secondary to pneumonia -Lactic acid peaked at 3.3 -Procalcitonin 30.87>>>7.11 -Continue ceftriaxone and azithromycin -Follow blood cultures -continue IVF  Lobar pneumonia -Personally reviewed chest x-ray-LLL, LUL opacities -Continue ceftriaxone and azithromycin -Follow blood cultures  COPD exacerbation -since SARS-CoV2 is neg- start pulmicort and duonebs -Continue IV Solu-Medrol>>>po prednisone  AKI -due to sepsis -baseline creatinine 0.7-0.9 -serum creatinine peaked 2.91 -am BMP  Elevated troponin -Secondary to demand ischemia in the setting of sepsis -no chest pain presently -Personally reviewed EKG--sinus  tachycardia, RBBB  Essential hypertension -increase carvedilol dose back to home dose  Tobacco abuse I have discussed tobacco cessation with the patient.  I have counseled the patient regarding the negative impacts of continued tobacco use including but not limited to lung cancer, COPD, and cardiovascular disease.  I have discussed alternatives to tobacco and modalities that may help facilitate tobacco cessation including but not limited to biofeedback, hypnosis, and medications.  Total time spent with tobacco counseling was 4 minutes.      Disposition Plan:   Home 12/22 if stable Family Communication:  Daughter updated on 12/21  Consultants:  none  Code Status:  FULL   DVT Prophylaxis:  [place] Lovenox   Procedures: As Listed in Progress Note Above  Antibiotics: Levofloxacin 12/19 Ceftriaxone 12/19>>> azithro 12/19>>>      Subjective: Patient denies fevers, chills, headache, chest pain, dyspnea, nausea, vomiting, diarrhea, abdominal pain, dysuria, hematuria, hematochezia, and melena.   Objective: Vitals:   02/17/19 0113 02/17/19 0536 02/17/19 0727 02/17/19 1333  BP:  (!) 169/84  (!) 159/75  Pulse:  85  97  Resp:  18  18  Temp:  (!) 97.4 F (36.3 C)  97.9 F (36.6 C)  TempSrc:  Oral  Oral  SpO2: 98% 96% 91% 94%  Weight:      Height:        Intake/Output Summary (Last 24 hours) at 02/17/2019 1438 Last data filed at 02/16/2019 1700 Gross per 24 hour  Intake 240 ml  Output --  Net 240 ml   Weight change:  Exam:   General:  Pt is alert, follows commands appropriately, not in acute distress  HEENT: No icterus, No thrush, No neck mass, [place]/AT  Cardiovascular: RRR, S1/S2, no rubs, no gallops  Respiratory: bibasilar rales. No wheeze  Abdomen:  Soft/+BS, non tender, non distended, no guarding  Extremities: No edema, No lymphangitis, No petechiae, No rashes, no synovitis   Data Reviewed: I have personally reviewed following labs and imaging  studies Basic Metabolic Panel: Recent Labs  Lab 02/15/19 1338 02/16/19 0200 02/17/19 0517  NA 131* 136 135  K 4.5 4.2 3.8  CL 97* 104 102  CO2 GLUCOSE 133* 137* 121*  BUN 29* 22 16  CREATININE 2.91* 1.40* 0.80  CALCIUM 8.8* 8.5* 8.5*  MG  --   --  1.8   Liver Function Tests: Recent Labs  Lab 02/15/19 1338  AST 22  ALT 26  ALKPHOS 56  BILITOT 0.5  PROT 6.6  ALBUMIN 3.0*   No results for input(s): LIPASE, AMYLASE in the last 168 hours. No results for input(s): AMMONIA in the last 168 hours. Coagulation Profile: No results for input(s): INR, PROTIME in the last 168 hours. CBC: Recent Labs  Lab 02/15/19 1338 02/16/19 0200 02/17/19 0517  WBC 21.9* 21.7* 14.8*  NEUTROABS 20.5*  --   --   HGB 13.5 12.2* 10.1*  HCT 39.8 35.6* 29.1*  MCV 105.3* 104.7* 102.5*  PLT 225 209 173   Cardiac Enzymes: No results for input(s): CKTOTAL, CKMB, CKMBINDEX, TROPONINI in the last 168 hours. BNP: Invalid input(s): POCBNP CBG: No results for input(s): GLUCAP in the last 168 hours. HbA1C: No results for input(s): HGBA1C in the last 72 hours. Urine analysis: No results found for: COLORURINE, APPEARANCEUR, LABSPEC, PHURINE, GLUCOSEU, HGBUR, BILIRUBINUR, KETONESUR, PROTEINUR, UROBILINOGEN, NITRITE, LEUKOCYTESUR Sepsis Labs: (procalcitonin:4,lacticidven:4) ) Recent Results (from the past 240 hour(s))  Blood culture (routine x 2)     Status: None (Preliminary result)   Collection Time: 02/15/19  2:37 PM   Specimen: BLOOD LEFT ARM  Result Value Ref Range Status   Specimen Description BLOOD LEFT ARM BOTTLES DRAWN AEROBIC AND ANAEROBIC  Final   Special Requests Blood Culture adequate volume  Final   Culture   Final    NO GROWTH < 24 HOURS Performed at [facility], [address], [place], [place] [address]    Report Status PENDING  Incomplete  Blood culture (routine x 2)     Status: None (Preliminary result)   Collection Time: 02/15/19  2:47 PM   Specimen:  Left Antecubital; Blood  Result Value Ref Range Status   Specimen Description   Final    LEFT ANTECUBITAL BOTTLES DRAWN AEROBIC AND ANAEROBIC   Special Requests Blood Culture adequate volume  Final   Culture   Final    NO GROWTH < 24 HOURS Performed at [facility], [address] [place], [place] [address]    Report Status PENDING  Incomplete  Respiratory Panel by RT PCR (Flu A&B, Covid) - Nasopharyngeal Swab     Status: None   Collection Time: 02/15/19  3:09 PM   Specimen: Nasopharyngeal Swab  Result Value Ref Range Status   SARS Coronavirus 2 by RT PCR NEGATIVE NEGATIVE Final    Comment: (NOTE) SARS-CoV-2 target nucleic acids are NOT DETECTED. The SARS-CoV-2 RNA is generally detectable in upper respiratoy specimens during the acute phase of infection. The lowest concentration of SARS-CoV-2 viral copies this assay can detect is 131 copies/mL. A negative result does not preclude SARS-Cov-2 infection and should not be used as the sole basis for treatment or other patient management decisions. A negative result may occur with  improper specimen collection/handling, submission of specimen other than nasopharyngeal swab, presence of viral mutation(s) within the  areas targeted by this assay, and inadequate number of viral copies (<131 copies/mL). A negative result must be combined with clinical observations, patient history, and epidemiological information. The expected result is Negative. Fact Sheet for Patients:  PinkCheek.be Fact Sheet for Healthcare Providers:  GravelBags.it This test is not yet ap proved or cleared by the [place] FDA and  has been authorized for detection and/or diagnosis of SARS-CoV-2 by FDA under an Emergency Use Authorization (EUA). This EUA will remain  in effect (meaning this test can be used) for the duration of the COVID-19 declaration under Section 564(b)(1) of the Act, 21 U.S.C. section  360bbb-3(b)(1), unless the authorization is terminated or revoked sooner.    Influenza A by PCR NEGATIVE NEGATIVE Final   Influenza B by PCR NEGATIVE NEGATIVE Final    Comment: (NOTE) The Xpert Xpress SARS-CoV-2/FLU/RSV assay is intended as an aid in  the diagnosis of influenza from Nasopharyngeal swab specimens and  should not be used as a sole basis for treatment. Nasal washings and  aspirates are unacceptable for Xpert Xpress SARS-CoV-2/FLU/RSV  testing. Fact Sheet for Patients: PinkCheek.be Fact Sheet for Healthcare Providers: GravelBags.it This test is not yet approved or cleared by the [place] FDA and  has been authorized for detection and/or diagnosis of SARS-CoV-2 by  FDA under an Emergency Use Authorization (EUA). This EUA will remain  in effect (meaning this test can be used) for the duration of the  Covid-19 declaration under Section 564(b)(1) of the Act, 21  U.S.C. section 360bbb-3(b)(1), unless the authorization is  terminated or revoked. Performed at [facility], [address] [place], [place] [address]   Respiratory Panel by PCR     Status: None   Collection Time: 02/15/19  3:09 PM   Specimen: Nasopharyngeal Swab; Respiratory  Result Value Ref Range Status   Adenovirus NOT DETECTED NOT DETECTED Final   Coronavirus 229E NOT DETECTED NOT DETECTED Final    Comment: (NOTE) The Coronavirus on the Respiratory Panel, DOES NOT test for the novel  Coronavirus (2019 nCoV)    Coronavirus HKU1 NOT DETECTED NOT DETECTED Final   Coronavirus NL63 NOT DETECTED NOT DETECTED Final   Coronavirus OC43 NOT DETECTED NOT DETECTED Final   Metapneumovirus NOT DETECTED NOT DETECTED Final   Rhinovirus / Enterovirus NOT DETECTED NOT DETECTED Final   Influenza A NOT DETECTED NOT DETECTED Final   Influenza B NOT DETECTED NOT DETECTED Final   Parainfluenza Virus 1 NOT DETECTED NOT DETECTED Final   Parainfluenza Virus 2 NOT  DETECTED NOT DETECTED Final   Parainfluenza Virus 3 NOT DETECTED NOT DETECTED Final   Parainfluenza Virus 4 NOT DETECTED NOT DETECTED Final   Respiratory Syncytial Virus NOT DETECTED NOT DETECTED Final   Bordetella pertussis NOT DETECTED NOT DETECTED Final   Chlamydophila pneumoniae NOT DETECTED NOT DETECTED Final   Mycoplasma pneumoniae NOT DETECTED NOT DETECTED Final    Comment: Performed at [facility], [place] [address] [place], [place] [address]  MRSA PCR Screening     Status: None   Collection Time: 02/16/19  9:30 AM   Specimen: Nasal Mucosa; Nasopharyngeal  Result Value Ref Range Status   MRSA by PCR NEGATIVE NEGATIVE Final    Comment:        The GeneXpert MRSA Assay (FDA approved for NASAL specimens only), is one component of a comprehensive MRSA colonization surveillance program. It is not intended to diagnose MRSA infection nor to guide or monitor treatment for MRSA infections. Performed at [facility], [address], [place], [place] [address]      Scheduled Meds: . azithromycin  500 mg Oral Daily  . budesonide (PULMICORT) nebulizer solution  0.5 mg Nebulization BID  . carvedilol  12.5 mg Oral BID WC  . enoxaparin (LOVENOX) injection  40 mg Subcutaneous Q24H  . feeding supplement (ENSURE ENLIVE)  237 mL Oral BID BM  . ipratropium-albuterol  3 mL Nebulization Q6H  . methylPREDNISolone (SOLU-MEDROL) injection  60 mg Intravenous Q6H   Continuous Infusions: . sodium chloride 75 mL/hr at 02/16/19 1135  . cefTRIAXone (ROCEPHIN)  IV 2 g (02/16/19 2259)    Procedures/Studies: DG Chest Portable 1 View  Result Date: 02/15/2019 CLINICAL DATA:  Left-sided chest pain, shortness of breath and history COPD. EXAM: PORTABLE CHEST 1 VIEW COMPARISON:  03/26/2018 FINDINGS: Stable heart size and appearance of dual-chamber pacemaker. New left mid and lower lung infiltrates since the prior chest x-ray. Stable hyperinflation and advanced emphysematous lung disease. No  pneumothorax or pleural fluid identified. IMPRESSION: 1. New left mid and lower lung infiltrates. 2. Stable hyperinflation and advanced emphysematous lung disease. Electronically Signed   By: Irish Lack M.D.   On: 02/15/2019 13:57    Catarina Hartshorn, DO  [facility] Pager [phone]  If 7PM-7AM, please contact night-coverage [email or web address] Password [email or web address] 02/17/2019, 2:38 PM   LOS: 2 days

## 2019-02-18 LAB — BASIC METABOLIC PANEL
Anion gap: 10 (ref 5–15)
BUN: 14 mg/dL (ref 8–23)
CO2: 24 mmol/L (ref 22–32)
Calcium: 8.3 mg/dL — ABNORMAL LOW (ref 8.9–10.3)
Chloride: 100 mmol/L (ref 98–111)
Creatinine, Ser: 0.7 mg/dL (ref 0.61–1.24)
GFR calc Af Amer: >60 mL/min (ref >60)
GFR calc non Af Amer: >60 mL/min (ref >60)
Glucose, Bld: 140 mg/dL — ABNORMAL HIGH (ref 70–99)
Potassium: 3.5 mmol/L (ref 3.5–5.1)
Sodium: 134 mmol/L — ABNORMAL LOW (ref 135–145)

## 2019-02-18 LAB — CBC
HCT: 29.8 % — ABNORMAL LOW (ref 39.0–52.0)
Hemoglobin: 10.4 g/dL — ABNORMAL LOW (ref 13.0–17.0)
MCH: 35.9 pg — ABNORMAL HIGH (ref 26.0–34.0)
MCHC: 34.9 g/dL (ref 30.0–36.0)
MCV: 102.8 fL — ABNORMAL HIGH (ref 80.0–100.0)
Platelets: 181 10*3/uL (ref 150–400)
RBC: 2.9 MIL/uL — ABNORMAL LOW (ref 4.22–5.81)
RDW: 12.3 % (ref 11.5–15.5)
WBC: 12.5 10*3/uL — ABNORMAL HIGH (ref 4.0–10.5)
nRBC: 0 % (ref 0.0–0.2)

## 2019-02-18 LAB — MAGNESIUM: Magnesium: 1.6 mg/dL — ABNORMAL LOW (ref 1.7–2.4)

## 2019-02-18 MED ORDER — ENSURE ENLIVE PO LIQD: 237.0000 mL | Freq: Two times a day (BID) | ORAL | 12 refills | Status: DC

## 2019-02-18 MED ORDER — PREDNISONE 20 MG PO TABS
60.0000 mg | ORAL_TABLET | Freq: Every day | ORAL | Status: DC
  Administered 2019-02-18: 60 mg via ORAL
  Filled 2019-02-18: qty 3

## 2019-02-18 MED ORDER — AZITHROMYCIN 500 MG PO TABS: 500.0000 mg | ORAL_TABLET | Freq: Every day | ORAL | 0 refills | Status: DC

## 2019-02-18 MED ORDER — PREDNISONE 10 MG PO TABS: 60.0000 mg | ORAL_TABLET | Freq: Every day | ORAL | 0 refills | Status: DC

## 2019-02-18 MED ORDER — CEFDINIR 300 MG PO CAPS: 300.0000 mg | ORAL_CAPSULE | Freq: Two times a day (BID) | ORAL | 0 refills | Status: DC

## 2019-02-18 MED ORDER — MAGNESIUM SULFATE 2 GM/50ML IV SOLN
2.0000 g | Freq: Once | INTRAVENOUS | Status: AC
  Administered 2019-02-18: 2 g via INTRAVENOUS

## 2019-02-18 NOTE — Evaluation (Signed)
Physical Therapy Evaluation Patient Details Name: Eric Diaz MRN: [id number] DOB: 13-Jul-1945 Today's Date: 02/18/2019   History of Present Illness  Eric Diaz is a 73 y.o. male with a history of COPD, hypertension, hyperlipidemia.  Patient presents with several days of increasing shortness of breath.  Shortness of breath worse with exertion and improved with rest.  Shortness of breath accompanied with aching chest pain that is intermittent and waxing and waning.  Not associated with rest.  No radiation and no diaphoresis.  Does have a cough that is nonproductive.  He had some chills earlier in the week, but is not sure about a fever.  His shortness of breath is worsening.  He does have a history of COPD and is on chronic long-acting bronchodilators with inhaled corticosteroids and is steroid-dependent.    Clinical Impression  Patient functioning at baseline for functional mobility and gait. Patient is independent with bed mobility, transfers, and ambulation. He is able to ambulate without assistive device safely and becomes minimally SOB upon returning to room which decreases with rest. Patient educated on importance of regular exercise and  living a healthy lifestyle. Patient does not require physical therapy follow up at this time. Patient discharged to care of nursing for ambulation daily as tolerated for length of stay.     Follow Up Recommendations No PT follow up    Equipment Recommendations  None recommended by PT    Recommendations for Other Services       Precautions / Restrictions Precautions Precautions: Fall Restrictions Weight Bearing Restrictions: No      Mobility  Bed Mobility Overal bed mobility: Independent             General bed mobility comments: to transition to seated EOB  Transfers Overall transfer level: Independent Equipment used: None             General transfer comment: slightly slow to transition to standing with use of  hands  Ambulation/Gait Ambulation/Gait assistance: Modified independent (Device/Increase time) Gait Distance (Feet): 200 Feet Assistive device: None Gait Pattern/deviations: Step-through pattern;Decreased step length - right;Decreased step length - left;Decreased stride length Gait velocity: slightly decreased   General Gait Details: slightly slow, becomes minimally SOB upon returning to room  Stairs            Wheelchair Mobility    Modified Rankin (Stroke Patients Only)       Balance Overall balance assessment: Independent Sitting-balance support: Feet supported;No upper extremity supported Sitting balance-Leahy Scale: Normal Sitting balance - Comments: seated EOB     Standing balance-Leahy Scale: Good Standing balance comment: good standing without AD                             Pertinent Vitals/Pain Pain Assessment: No/denies pain    Home Living Family/patient expects to be discharged to:: Private residence Living Arrangements: Spouse/significant other;Other relatives Available Help at Discharge: Family Type of Home: House Home Access: Stairs to enter Entrance Stairs-Rails: None Entrance Stairs-Number of Steps: 2 Home Layout: Two level;Able to live on main level with bedroom/bathroom Home Equipment: None Additional Comments: lives on 1st level, does not use AD, does not have AD at home    Prior Function Level of Independence: Independent         Comments: patient is a limited community ambulator secondary to becoming SOB and fatigued with COPD     Hand Dominance        Extremity/Trunk  Assessment   Upper Extremity Assessment Upper Extremity Assessment: Overall WFL for tasks assessed    Lower Extremity Assessment Lower Extremity Assessment: Generalized weakness    Cervical / Trunk Assessment Cervical / Trunk Assessment: Normal  Communication   Communication: No difficulties  Cognition Arousal/Alertness: Awake/alert Behavior  During Therapy: WFL for tasks assessed/performed Overall Cognitive Status: Within Functional Limits for tasks assessed                                        General Comments      Exercises     Assessment/Plan    PT Assessment Patent does not need any further PT services  PT Problem List Decreased activity tolerance;Decreased mobility;Decreased strength       PT Treatment Interventions      PT Goals (Current goals can be found in the Care Plan section)  Acute Rehab PT Goals Patient Stated Goal: Return home PT Goal Formulation: With patient Time For Goal Achievement: 02/18/19 Potential to Achieve Goals: Good    Frequency     Barriers to discharge        Co-evaluation               AM-PAC PT "6 Clicks" Mobility  Outcome Measure Help needed turning from your back to your side while in a flat bed without using bedrails?: None Help needed moving from lying on your back to sitting on the side of a flat bed without using bedrails?: None Help needed moving to and from a bed to a chair (including a wheelchair)?: None Help needed standing up from a chair using your arms (e.g., wheelchair or bedside chair)?: None Help needed to walk in hospital room?: None Help needed climbing 3-5 steps with a railing? : A Little 6 Click Score: 23    End of Session Equipment Utilized During Treatment: Gait belt Activity Tolerance: Patient tolerated treatment well Patient left: in bed;with call bell/phone within reach Nurse Communication: Mobility status PT Visit Diagnosis: Unsteadiness on feet (R26.81);Other abnormalities of gait and mobility (R26.89);Muscle weakness (generalized) (M62.81)    Time: 1610-9604 PT Time Calculation (min) (ACUTE ONLY): 13 min   Charges:   PT Evaluation $PT Eval Low Complexity: 1 Low PT Treatments $Therapeutic Activity: 8-22 mins        8:32 AM, 02/18/19 Mearl Latin PT, DPT Physical Therapist at [facility]

## 2019-02-18 NOTE — Discharge Summary (Signed)
Physician Discharge Summary  Eric Diaz RUE:[id number] DOB: March 21, 1945 DOA: 02/15/2019  PCP: System, Pcp Not In  Admit date: 02/15/2019 Discharge date: 02/18/2019  Admitted From: Home Disposition:  Home  Recommendations for Outpatient Follow-up:  1. Follow up with PCP in 1-2 weeks 2. Please obtain BMP/CBC in one week     Discharge Condition: Stable CODE STATUS: FULL Diet recommendation: Heart Healthy    Brief/Interim Summary: [age] year old male with a history of steroid-dependent COPD, hypertension, hyperlipidemia, third-degree AV block status post PPM presenting with 4-day history of intermittent chest discomfort, coughing, and dyspnea on exertion. The patient has had some subjective fevers and chills. He denied any nausea, vomiting, diarrhea, abdominal pain, dysuria, hematuria, hematochezia, melena. He denies any sick or Covid contacts. Because of worsening shortness of breath, he presented for further evaluation and treatment. He denies any new medications. Upon presentation, the patient was afebrile with soft blood pressures with systolic BP in the 90s. He was fluid resuscitated with improvement of his blood pressure. Lactic acid peaked at 3.3. BMP showed a serum creatinine of 2.91 but was otherwise unremarkable. LFTs were unremarkable. WBC was 21.9 with hemoglobin 13.5, platelets 225,000. Procalcitonin was 30.87. Chest x-ray showed LLL, LULopacities. The patient was started on ceftriaxone and azithromycin. He received a dose of levofloxacin in the emergency department.  Discharge Diagnoses:   Sepsiswith organ dysfunction -Present on admission -Secondary to pneumonia -Lactic acid peaked at 3.3 -Procalcitonin 30.87>>>7.11 -Continue ceftriaxone and azithromycin -Follow blood cultures--neg to date -continue IVF>>>saline lock  Lobar pneumonia -Personally reviewed chest x-ray-LLL, LUL opacities -Continue ceftriaxone and azithromycin -Follow blood  cultures--neg to date -d/c home with cefdinir and azithromycin x 4 more days to complete one week  COPD exacerbation -since SARS-CoV2 is neg- start pulmicort and duonebs -Continue IV Solu-Medrol>>>po prednisone taper -ambulatory pulse ox on day of discharge did not show any desaturation <88%  AKI -due to sepsis -baseline creatinine 0.7-0.9 -serum creatinine peaked 2.91 -serum creatinine 0.70 on day of d/c -am BMP  Elevated troponin -Secondary to demand ischemia in the setting of sepsis -no chest pain presently -Personally reviewed EKG--sinustachycardia, RBBB  Essential hypertension -increase carvedilol dose back to home dose  Tobacco abuse I have discussed tobacco cessation with the patient. I have counseled the patient regarding the negative impacts of continued tobacco use including but not limited to lung cancer, COPD, and cardiovascular disease. I have discussed alternatives to tobacco and modalities that may help facilitate tobacco cessation including but not limited to biofeedback, hypnosis, and medications. Total time spent with tobacco counseling was 4 minutes.  Discharge Instructions   Allergies as of 02/18/2019      Reactions   Bee Venom Anaphylaxis, Swelling      Medication List    TAKE these medications   acetaminophen 500 MG tablet Commonly known as: TYLENOL Take 1,000 mg by mouth 2 (two) times daily.   albuterol 108 (90 Base) MCG/ACT inhaler Commonly known as: VENTOLIN HFA Inhale 1 puff into the lungs every 6 (six) hours as needed for wheezing or shortness of breath. Plz see PCP for additional refills.   azithromycin 500 MG tablet Commonly known as: ZITHROMAX Take 1 tablet (500 mg total) by mouth daily.   B-12 PO Take 1 tablet by mouth daily.   carvedilol 12.5 MG tablet Commonly known as: COREG Take 12.5 mg by mouth 2 (two) times daily.   cefdinir 300 MG capsule Commonly known as: OMNICEF Take 1 capsule (300 mg total) by mouth 2 (two)  times daily.  feeding supplement (ENSURE ENLIVE) Liqd Take 237 mLs by mouth 2 (two) times daily between meals.   ibuprofen 200 MG tablet Commonly known as: ADVIL Take 200 mg by mouth 2 (two) times daily.   multivitamin with minerals Tabs tablet Take 1 tablet by mouth daily.   predniSONE 10 MG tablet Commonly known as: DELTASONE Take 10 mg by mouth daily. What changed: Another medication with the same name was added. Make sure you understand how and when to take each.   predniSONE 10 MG tablet Commonly known as: DELTASONE Take 6 tablets (60 mg total) by mouth daily with breakfast. And decrease by one tablet daily What changed: You were already taking a medication with the same name, and this prescription was added. Make sure you understand how and when to take each.   Wixela Inhub 250-50 MCG/DOSE Aepb Generic drug: Fluticasone-Salmeterol Inhale 1 puff into the lungs daily.       Allergies  Allergen Reactions  . Bee Venom Anaphylaxis and Swelling    Consultations:  none   Procedures/Studies: DG Chest Portable 1 View  Result Date: 02/15/2019 CLINICAL DATA:  Left-sided chest pain, shortness of breath and history COPD. EXAM: PORTABLE CHEST 1 VIEW COMPARISON:  03/26/2018 FINDINGS: Stable heart size and appearance of dual-chamber pacemaker. New left mid and lower lung infiltrates since the prior chest x-ray. Stable hyperinflation and advanced emphysematous lung disease. No pneumothorax or pleural fluid identified. IMPRESSION: 1. New left mid and lower lung infiltrates. 2. Stable hyperinflation and advanced emphysematous lung disease. Electronically Signed   By: Irish Lack M.D.   On: 02/15/2019 13:57        Discharge Exam: Vitals:   02/18/19 0733 02/18/19 0738  BP:    Pulse:    Resp:    Temp:    SpO2: 90% 90%   Vitals:   02/18/19 0103 02/18/19 0542 02/18/19 0733 02/18/19 0738  BP:  (!) 157/80    Pulse:  77    Resp:  18    Temp:  98.3 F (36.8 C)      TempSrc:      SpO2: 95% 94% 90% 90%  Weight:      Height:        General: Pt is alert, awake, not in acute distress Cardiovascular: RRR, S1/S2 +, no rubs, no gallops Respiratory: diminished BS, no wheeze, bibasilar rales Abdominal: Soft, NT, ND, bowel sounds + Extremities: no edema, no cyanosis   The results of significant diagnostics from this hospitalization (including imaging, microbiology, ancillary and laboratory) are listed below for reference.    Significant Diagnostic Studies: DG Chest Portable 1 View  Result Date: 02/15/2019 CLINICAL DATA:  Left-sided chest pain, shortness of breath and history COPD. EXAM: PORTABLE CHEST 1 VIEW COMPARISON:  03/26/2018 FINDINGS: Stable heart size and appearance of dual-chamber pacemaker. New left mid and lower lung infiltrates since the prior chest x-ray. Stable hyperinflation and advanced emphysematous lung disease. No pneumothorax or pleural fluid identified. IMPRESSION: 1. New left mid and lower lung infiltrates. 2. Stable hyperinflation and advanced emphysematous lung disease. Electronically Signed   By: Irish Lack M.D.   On: 02/15/2019 13:57     Microbiology: Recent Results (from the past 240 hour(s))  Blood culture (routine x 2)     Status: None (Preliminary result)   Collection Time: 02/15/19  2:37 PM   Specimen: BLOOD LEFT ARM  Result Value Ref Range Status   Specimen Description BLOOD LEFT ARM BOTTLES DRAWN AEROBIC AND ANAEROBIC  Final  Special Requests Blood Culture adequate volume  Final   Culture   Final    NO GROWTH 3 DAYS Performed at [facility], [address] [place], [place] [address]    Report Status PENDING  Incomplete  Blood culture (routine x 2)     Status: None (Preliminary result)   Collection Time: 02/15/19  2:47 PM   Specimen: Left Antecubital; Blood  Result Value Ref Range Status   Specimen Description   Final    LEFT ANTECUBITAL BOTTLES DRAWN AEROBIC AND ANAEROBIC   Special Requests Blood Culture  adequate volume  Final   Culture   Final    NO GROWTH 3 DAYS Performed at [facility], [address], [place], [place] [address]    Report Status PENDING  Incomplete  Respiratory Panel by RT PCR (Flu A&B, Covid) - Nasopharyngeal Swab     Status: None   Collection Time: 02/15/19  3:09 PM   Specimen: Nasopharyngeal Swab  Result Value Ref Range Status   SARS Coronavirus 2 by RT PCR NEGATIVE NEGATIVE Final    Comment: (NOTE) SARS-CoV-2 target nucleic acids are NOT DETECTED. The SARS-CoV-2 RNA is generally detectable in upper respiratoy specimens during the acute phase of infection. The lowest concentration of SARS-CoV-2 viral copies this assay can detect is 131 copies/mL. A negative result does not preclude SARS-Cov-2 infection and should not be used as the sole basis for treatment or other patient management decisions. A negative result may occur with  improper specimen collection/handling, submission of specimen other than nasopharyngeal swab, presence of viral mutation(s) within the areas targeted by this assay, and inadequate number of viral copies (<131 copies/mL). A negative result must be combined with clinical observations, patient history, and epidemiological information. The expected result is Negative. Fact Sheet for Patients:  https://www.moore.com/ Fact Sheet for Healthcare Providers:  https://www.young.biz/ This test is not yet ap proved or cleared by the [place] FDA and  has been authorized for detection and/or diagnosis of SARS-CoV-2 by FDA under an Emergency Use Authorization (EUA). This EUA will remain  in effect (meaning this test can be used) for the duration of the COVID-19 declaration under Section 564(b)(1) of the Act, 21 U.S.C. section 360bbb-3(b)(1), unless the authorization is terminated or revoked sooner.    Influenza A by PCR NEGATIVE NEGATIVE Final   Influenza B by PCR NEGATIVE NEGATIVE Final    Comment:  (NOTE) The Xpert Xpress SARS-CoV-2/FLU/RSV assay is intended as an aid in  the diagnosis of influenza from Nasopharyngeal swab specimens and  should not be used as a sole basis for treatment. Nasal washings and  aspirates are unacceptable for Xpert Xpress SARS-CoV-2/FLU/RSV  testing. Fact Sheet for Patients: https://www.moore.com/ Fact Sheet for Healthcare Providers: https://www.young.biz/ This test is not yet approved or cleared by the [place] FDA and  has been authorized for detection and/or diagnosis of SARS-CoV-2 by  FDA under an Emergency Use Authorization (EUA). This EUA will remain  in effect (meaning this test can be used) for the duration of the  Covid-19 declaration under Section 564(b)(1) of the Act, 21  U.S.C. section 360bbb-3(b)(1), unless the authorization is  terminated or revoked. Performed at [facility], [address], [place], [place] [address]   Respiratory Panel by PCR     Status: None   Collection Time: 02/15/19  3:09 PM   Specimen: Nasopharyngeal Swab; Respiratory  Result Value Ref Range Status   Adenovirus NOT DETECTED NOT DETECTED Final   Coronavirus 229E NOT DETECTED NOT DETECTED  Final    Comment: (NOTE) The Coronavirus on the Respiratory Panel, DOES NOT test for the novel  Coronavirus (2019 nCoV)    Coronavirus HKU1 NOT DETECTED NOT DETECTED Final   Coronavirus NL63 NOT DETECTED NOT DETECTED Final   Coronavirus OC43 NOT DETECTED NOT DETECTED Final   Metapneumovirus NOT DETECTED NOT DETECTED Final   Rhinovirus / Enterovirus NOT DETECTED NOT DETECTED Final   Influenza A NOT DETECTED NOT DETECTED Final   Influenza B NOT DETECTED NOT DETECTED Final   Parainfluenza Virus 1 NOT DETECTED NOT DETECTED Final   Parainfluenza Virus 2 NOT DETECTED NOT DETECTED Final   Parainfluenza Virus 3 NOT DETECTED NOT DETECTED Final   Parainfluenza Virus 4 NOT DETECTED NOT DETECTED Final   Respiratory Syncytial Virus NOT DETECTED  NOT DETECTED Final   Bordetella pertussis NOT DETECTED NOT DETECTED Final   Chlamydophila pneumoniae NOT DETECTED NOT DETECTED Final   Mycoplasma pneumoniae NOT DETECTED NOT DETECTED Final    Comment: Performed at [facility], [place], [place], [place] [address]  MRSA PCR Screening     Status: None   Collection Time: 02/16/19  9:30 AM   Specimen: Nasal Mucosa; Nasopharyngeal  Result Value Ref Range Status   MRSA by PCR NEGATIVE NEGATIVE Final    Comment:        The GeneXpert MRSA Assay (FDA approved for NASAL specimens only), is one component of a comprehensive MRSA colonization surveillance program. It is not intended to diagnose MRSA infection nor to guide or monitor treatment for MRSA infections. Performed at [facility], [address], [place], [place] [address]      Labs: Basic Metabolic Panel: Recent Labs  Lab 02/15/19 1338 02/16/19 0200 02/17/19 0517 02/18/19 0514  NA 131* 136 135 134*  K 4.5 4.2 3.8 3.5  CL 97* 104 102 100  CO2 22 23 23 24   GLUCOSE 133* 137* 121* 140*  BUN 29* 22 16 14   CREATININE 2.91* 1.40* 0.80 0.70  CALCIUM 8.8* 8.5* 8.5* 8.3*  MG  --   --  1.8 1.6*   Liver Function Tests: Recent Labs  Lab 02/15/19 1338  AST 22  ALT 26  ALKPHOS 56  BILITOT 0.5  PROT 6.6  ALBUMIN 3.0*   No results for input(s): LIPASE, AMYLASE in the last 168 hours. No results for input(s): AMMONIA in the last 168 hours. CBC: Recent Labs  Lab 02/15/19 1338 02/16/19 0200 02/17/19 0517 02/18/19 0514  WBC 21.9* 21.7* 14.8* 12.5*  NEUTROABS 20.5*  --   --   --   HGB 13.5 12.2* 10.1* 10.4*  HCT 39.8 35.6* 29.1* 29.8*  MCV 105.3* 104.7* 102.5* 102.8*  PLT 225 209 173 181   Cardiac Enzymes: No results for input(s): CKTOTAL, CKMB, CKMBINDEX, TROPONINI in the last 168 hours. BNP: Invalid input(s): POCBNP CBG: No results for input(s): GLUCAP in the last 168 hours.  Time coordinating discharge:  36 minutes  Signed:  Orson Eva, DO [facility] Pager: [phone] 02/18/2019, 9:03 AM

## 2019-02-18 NOTE — Discharge Instructions (Signed)
Coccidioidomycosis Coccidioidomycosis is an infection caused by breathing in the spores of a certain type of fungus. Spores are very small cells that fungi release to grow more fungi. The fungus that causes this infection lives in the soil of regions that have a dry and windy climate. These regions include the [place], central [place], [address], and parts of [place], [place], and [place]. The infection is usually mild and goes away without treatment (primary coccidioidomycosis). In some cases, the infection may be severe. It can cause lung damage and spread to the brain, spinal cord, skin, bones, or joints (progressive coccidioidomycosis). Coccidioidomycosis does not spread from person to person (is not contagious). What are the causes? This condition is caused by spores of a fungus called Coccidioides immitis. You can breathe in these spores when they are picked up by the wind. In your lungs, these spores may grow, break open, and release tiny spores (endospores) that can spread through your body. You cannot see spores or endospores unless you use a microscope. It is very common to be exposed to the spores of the fungus, but it is not common to develop an infection from exposure. What increases the risk? You are more likely to develop this condition if you:  Live in or travel to areas where the fungus is common.  Are 60 or older.  Work with livestock.  Are pregnant.  Have diabetes.  Are of African American, [place], or American [place] descent. A progressive infection is more likely to develop in people who have a weakened disease-fighting system (immune system). This may include people who:  Have HIV or AIDS.  Have had an organ transplant.  Take certain medicines, such as steroids or cancer drugs. What are the signs or symptoms? People with a primary infection often have no symptoms. If you do have symptoms, they may develop 1-3 weeks  after breathing in the fungal spores. Symptoms of a primary infection may include:  Headache.  Cough.  Fever.  Chills.  Shortness of breath.  Night sweats.  Muscle aches. Symptoms of a progressive infection may develop months or years after a primary infection, and may include:  Swollen and painful joints.  A skin infection that drains fluid.  Headache and stiff neck.  Confusion.  Double vision.  Clumsiness. How is this diagnosed? This condition may be diagnosed based on:  Your symptoms, travel history, and medical history.  A physical exam.  Blood tests.  Removing a tissue sample to examine under a microscope (biopsy) or to culture in a lab.  Imaging tests such as a chest X-ray or a CT scan. How is this treated? Treatment for this condition depends on how severe your symptoms are.  For a primary infection, most people do not need treatment. If you have serious symptoms or if you are at risk for a progressive infection, you may be given antifungal medicine to take by mouth (oral medicine).  For a progressive infection, you will be treated with antifungal medicine. You will likely need to take this medicine for longer than 6 months. In some cases, you may need to be treated with IV antifungal medicine at a hospital. After the hospital stay, you will need to take oral antifungal medicine at home. If you have a weakened immune system, you may need to take this medicine for the rest of your life. Follow these instructions at home:  Rest at home until your fever goes away.  Return to your normal activities as told  by your health care provider. Ask your health care provider what activities are safe for you.  Take over-the-counter and prescription medicines only as told by your health care provider.  If you were prescribed an antifungal medicine, take it as told by your health care provider. Do not stop taking the antifungal even if you start to feel better.  Drink  enough fluid to keep your urine pale yellow.  Keep all follow-up visits as told by your health care provider. This is important. Contact a health care provider if you:  Notice that your symptoms do not improve with treatment.  Develop symptoms of progressive coccidioidomycosis. Get help right away if you:  Have chest pain.  Have trouble breathing.  Cough up blood.  Develop a headache and a stiff neck.  Have confusion. Summary  Coccidioidomycosis is an infection caused by breathing in the spores of a certain type of fungus.  The fungus that causes this infectionlives in the soil of regions that have a dry and windy climate. You can breathe in these spores when they are picked up by the wind.  The infection is usually mild and goes away without treatment. However, it sometimes is severe and can cause lung damage and spread to the brain, spinal cord, skin, bones, or joints.  It is very common to be exposed to the spores of the fungus, but it is not common to develop an infection from exposure. This information is not intended to replace advice given to you by your health care provider. Make sure you discuss any questions you have with your health care provider. Document Released: 02/03/2002 Document Revised: 06/05/2018 Document Reviewed: 01/02/2017 Elsevier Patient Education  [place] Pneumonia, Adult Pneumonia is an infection of the lungs. It causes swelling in the airways of the lungs. Mucus and fluid may also build up inside the airways. One type of pneumonia can happen while a person is in a hospital. A different type can happen when a person is not in a hospital (community-acquired pneumonia).  What are the causes?  This condition is caused by germs (viruses, bacteria, or fungi). Some types of germs can be passed from one person to another. This can happen when you breathe in droplets from the cough or sneeze of an infected person. What  increases the risk? You are more likely to develop this condition if you:  Have a long-term (chronic) disease, such as: ? Chronic obstructive pulmonary disease (COPD). ? Asthma. ? Cystic fibrosis. ? Congestive heart failure. ? Diabetes. ? Kidney disease.  Have HIV.  Have sickle cell disease.  Have had your spleen removed.  Do not take good care of your teeth and mouth (poor dental hygiene).  Have a medical condition that increases the risk of breathing in droplets from your own mouth and nose.  Have a weakened body defense system (immune system).  Are a smoker.  Travel to areas where the germs that cause this illness are common.  Are around certain animals or the places they live. What are the signs or symptoms?  A dry cough.  A wet (productive) cough.  Fever.  Sweating.  Chest pain. This often happens when breathing deeply or coughing.  Fast breathing or trouble breathing.  Shortness of breath.  Shaking chills.  Feeling tired (fatigue).  Muscle aches. How is this treated? Treatment for this condition depends on many things. Most adults can be treated at home. In some cases, treatment must happen in a hospital. Treatment  may include:  Medicines given by mouth or through an IV tube.  Being given extra oxygen.  Respiratory therapy. In rare cases, treatment for very bad pneumonia may include:  Using a machine to help you breathe.  Having a procedure to remove fluid from around your lungs. Follow these instructions at home: Medicines  Take over-the-counter and prescription medicines only as told by your doctor. ? Only take cough medicine if you are losing sleep.  If you were prescribed an antibiotic medicine, take it as told by your doctor. Do not stop taking the antibiotic even if you start to feel better. General instructions   Sleep with your head and neck raised (elevated). You can do this by sleeping in a recliner or by putting a few pillows  under your head.  Rest as needed. Get at least 8 hours of sleep each night.  Drink enough water to keep your pee (urine) pale yellow.  Eat a healthy diet that includes plenty of vegetables, fruits, whole grains, low-fat dairy products, and lean protein.  Do not use any products that contain nicotine or tobacco. These include cigarettes, e-cigarettes, and chewing tobacco. If you need help quitting, ask your doctor.  Keep all follow-up visits as told by your doctor. This is important. How is this prevented? A shot (vaccine) can help prevent pneumonia. Shots are often suggested for:  People older than 72 years of age.  People older than 72 years of age who: ? Are having cancer treatment. ? Have long-term (chronic) lung disease. ? Have problems with their body's defense system. You may also prevent pneumonia if you take these actions:  Get the flu (influenza) shot every year.  Go to the dentist as often as told.  Wash your hands often. If you cannot use soap and water, use hand sanitizer. Contact a doctor if:  You have a fever.  You lose sleep because your cough medicine does not help. Get help right away if:  You are short of breath and it gets worse.  You have more chest pain.  Your sickness gets worse. This is very serious if: ? You are an older adult. ? Your body's defense system is weak.  You cough up blood. Summary  Pneumonia is an infection of the lungs.  Most adults can be treated at home. Some will need treatment in a hospital.  Drink enough water to keep your pee pale yellow.  Get at least 8 hours of sleep each night. This information is not intended to replace advice given to you by your health care provider. Make sure you discuss any questions you have with your health care provider. Document Released: 08/02/2007 Document Revised: 06/05/2018 Document Reviewed: 10/11/2017 Elsevier Patient Education  2020 [employer]

## 2019-02-20 LAB — CULTURE, BLOOD (ROUTINE X 2)
Culture: NO GROWTH
Culture: NO GROWTH
Special Requests: ADEQUATE
Special Requests: ADEQUATE

## 2019-03-20 LAB — CUP PACEART REMOTE DEVICE CHECK
Battery Remaining Longevity: 106 mo
Battery Remaining Percentage: 95.5 %
Battery Voltage: 3.01 V
Brady Statistic AP VP Percent: 1 %
Brady Statistic AP VS Percent: 1 %
Brady Statistic AS VP Percent: 99 %
Brady Statistic AS VS Percent: 1 %
Brady Statistic RA Percent Paced: 1 %
Brady Statistic RV Percent Paced: 99 %
Date Time Interrogation Session: 20210121024841
Implantable Lead Implant Date: 20200108
Implantable Lead Implant Date: 20200108
Implantable Lead Location: 753859
Implantable Lead Location: 753860
Implantable Lead Model: 5076
Implantable Lead Model: 5076
Implantable Pulse Generator Implant Date: 20200108
Lead Channel Impedance Value: 390 Ohm
Lead Channel Impedance Value: 430 Ohm
Lead Channel Pacing Threshold Amplitude: 0.75 V
Lead Channel Pacing Threshold Amplitude: 1.25 V
Lead Channel Pacing Threshold Pulse Width: 0.4 ms
Lead Channel Pacing Threshold Pulse Width: 0.5 ms
Lead Channel Sensing Intrinsic Amplitude: 12 mV
Lead Channel Sensing Intrinsic Amplitude: 4.7 mV
Lead Channel Setting Pacing Amplitude: 2 V
Lead Channel Setting Pacing Amplitude: 2.5 V
Lead Channel Setting Pacing Pulse Width: 0.4 ms
Lead Channel Setting Sensing Sensitivity: 4 mV
Pulse Gen Model: 2272
Pulse Gen Serial Number: 9091999

## 2019-03-21 ENCOUNTER — Ambulatory Visit (INDEPENDENT_AMBULATORY_CARE_PROVIDER_SITE_OTHER): Payer: Medicare HMO | Admitting: *Deleted

## 2019-03-21 DIAGNOSIS — I459 Conduction disorder, unspecified: Secondary | ICD-10-CM

## 2019-04-01 DIAGNOSIS — Z950 Presence of cardiac pacemaker: Secondary | ICD-10-CM | POA: Insufficient documentation

## 2019-04-01 NOTE — Progress Notes (Signed)
Patient Care Team: System, Pcp Not In as PCP - General   HPI  Eric Diaz is a 74 y.o. 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  He developed high ventricular pacing thresholds. Underwent lead revision 2 weeks later   Hospitalized 12/20 with pneumonia and sepsis complicated by acute renal failure with subsequent improvement blood cultures were negative x2  Now w complaints of " dyspnea and nocturnal dyspnea and orthopnea.  Denies associated chest discomfort.  No peripheral edema.  Better following the pacemaker and then got gradually worse.  Cardiac risk factors include smoking.  DATE TEST EF   1/20 Echo   55-60 %         Date Cr K Hgb  1/20 0.8 3.9 13.7  12/20 2.9>>0.7 3.5 10.4        Records and Results Reviewed   Past Medical History:  Diagnosis Date  . COPD (chronic obstructive pulmonary disease) (HCC)   . High cholesterol   . Hypertension     Past Surgical History:  Procedure Laterality Date  . KNEE ARTHROSCOPY     left  . LEAD REVISION/REPAIR N/A 03/25/2018   Procedure: LEAD REVISION/REPAIR;  Surgeon: Duke Salvia, MD;  Location: [facility] INVASIVE CV LAB;  Service: Cardiovascular;  Laterality: N/A;  . PACEMAKER IMPLANT N/A 03/06/2018   Procedure: PACEMAKER IMPLANT;  Surgeon: Duke Salvia, MD;  Location: [facility] INVASIVE CV LAB;  Service: Cardiovascular;  Laterality: N/A;    Current Meds  Medication Sig  . acetaminophen (TYLENOL) 500 MG tablet Take 1,000 mg by mouth 2 (two) times daily.   Marland Kitchen albuterol (VENTOLIN HFA) 108 (90 Base) MCG/ACT inhaler Inhale 1 puff into the lungs every 6 (six) hours as needed for wheezing or shortness of breath. Plz see PCP for additional refills.  Marland Kitchen aspirin EC 81 MG tablet Take 81 mg by mouth daily.  . carvedilol (COREG) 12.5 MG tablet Take 12.5 mg by mouth 2 (two) times daily.   . Cyanocobalamin (B-12 PO) Take 1 tablet by mouth daily.  . feeding supplement, ENSURE ENLIVE,  (ENSURE ENLIVE) LIQD Take 237 mLs by mouth 2 (two) times daily between meals.  Marland Kitchen ibuprofen (ADVIL,MOTRIN) 200 MG tablet Take 200 mg by mouth 2 (two) times daily.   . Multiple Vitamin (MULTIVITAMIN WITH MINERALS) TABS tablet Take 1 tablet by mouth daily.  . predniSONE (DELTASONE) 10 MG tablet Take 10 mg by mouth daily.  Monte Fantasia INHUB 250-50 MCG/DOSE AEPB Inhale 1 puff into the lungs daily.     Allergies  Allergen Reactions  . Bee Venom Anaphylaxis and Swelling      Review of Systems negative except from HPI and PMH  Physical Exam BP 140/74   Pulse 81   Ht 6' (1.829 m)   Wt 164 lb 12.8 oz (74.8 kg)   SpO2 96%   BMI 22.35 kg/m  Well developed and well nourished in no acute distress HENT normal Neck supple with JVP-flat Clear Device pocket well healed; without hematoma or erythema.  There is no tethering  Regular rate and rhythm, no murmur Abd-soft with active BS No Clubbing cyanosis   edema Skin-warm and dry A & Oriented  Grossly normal sensory and motor function  ECG sinus with P synchronous pacing personally reviewed from 02/15/2019   Assessment and  Plan  CHB-high-grade   Syncope  Hypertension  Dyspnea on exertion  Pacemaker-Saint Jude status post RV lead revision   BP well  controlled  Euvolemic continue current meds  No interval syncope  Dyspnea is of some concern.  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  We will repeat echocardiogram to look for changes in LV function.  He is also at high risk for coronary disease.  Does not have lipids in our records but he may have that with his PCP.  We will also undertake Myoview scanning as the dyspnea can be anginal equivalent.  We spent more than 50% of our >30  min visit in face to face counseling regarding the above      Current medicines are reviewed at length with the patient today .  The patient does not  have concerns regarding medicines.

## 2019-04-02 ENCOUNTER — Ambulatory Visit: Payer: Medicare HMO | Admitting: Internal Medicine

## 2019-04-02 ENCOUNTER — Other Ambulatory Visit: Payer: Self-pay

## 2019-04-02 ENCOUNTER — Encounter: Payer: Self-pay | Admitting: Internal Medicine

## 2019-04-02 VITALS — BP 140/74 | HR 81 | Ht 72.0 in | Wt 164.8 lb

## 2019-04-02 DIAGNOSIS — Z950 Presence of cardiac pacemaker: Secondary | ICD-10-CM

## 2019-04-02 DIAGNOSIS — R002 Palpitations: Secondary | ICD-10-CM

## 2019-04-02 DIAGNOSIS — I442 Atrioventricular block, complete: Secondary | ICD-10-CM

## 2019-04-02 NOTE — Patient Instructions (Signed)
Medication Instructions:  Your physician recommends that you continue on your current medications as directed. Please refer to the Current Medication list given to you today.  Labwork: None ordered.  Testing/Procedures:Your physician recommends that you continue on your current medications as directed. Please refer to the Current Medication list given to you today.   Your physician recommends that you continue on your current medications as directed. Please refer to the Current Medication list given to you today  Your physician has requested that you have a lexiscan myoview. For further information please visit [email or web address] Please follow instruction sheet, as given.    Your physician has requested that you have an echocardiogram. Echocardiography is a painless test that uses sound waves to create images of your heart. It provides your doctor with information about the size and shape of your heart and how well your heart's chambers and valves are working. This procedure takes approximately one hour. There are no restrictions for this procedure.    Follow-Up: Your physician wants you to follow-up in 12 months with Dr Logan Bores will receive a reminder letter in the mail two months in advance. If you don't receive a letter, please call our office to schedule the follow-up appointment.  Remote monitoring is used to monitor your Pacemaker of ICD from home. This monitoring reduces the number of office visits required to check your device to one time per year. It allows [place] to keep an eye on the functioning of your device to ensure it is working properly. .  Any Other Special Instructions Will Be Listed Below (If Applicable).  If you need a refill on your cardiac medications before your next appointment, please call your pharmacy.

## 2019-04-15 ENCOUNTER — Telehealth (HOSPITAL_COMMUNITY): Payer: Self-pay | Admitting: *Deleted

## 2019-04-15 NOTE — Telephone Encounter (Signed)
Left message on voicemail in reference to upcoming appointment scheduled for 04/18/19. Phone number given for a call back so details instructions can be given. Sharon S Brooks   

## 2019-04-18 ENCOUNTER — Encounter (HOSPITAL_COMMUNITY): Payer: Medicare HMO

## 2019-04-18 ENCOUNTER — Other Ambulatory Visit (HOSPITAL_COMMUNITY): Payer: Medicare HMO

## 2019-05-19 ENCOUNTER — Telehealth (HOSPITAL_COMMUNITY): Payer: Self-pay

## 2019-05-19 NOTE — Telephone Encounter (Signed)
Spoke with the patient, detailed instructions were given. He stated that he understood and would be here for his test. Asked to call back with any questions. S.Rupinder Livingston EMTP 

## 2019-05-22 ENCOUNTER — Other Ambulatory Visit: Payer: Self-pay

## 2019-05-22 ENCOUNTER — Ambulatory Visit (HOSPITAL_BASED_OUTPATIENT_CLINIC_OR_DEPARTMENT_OTHER): Payer: Medicare HMO

## 2019-05-22 ENCOUNTER — Ambulatory Visit (HOSPITAL_COMMUNITY): Payer: Medicare HMO | Attending: Cardiology

## 2019-05-22 DIAGNOSIS — I442 Atrioventricular block, complete: Secondary | ICD-10-CM | POA: Diagnosis not present

## 2019-05-22 DIAGNOSIS — R0600 Dyspnea, unspecified: Secondary | ICD-10-CM | POA: Diagnosis not present

## 2019-05-22 DIAGNOSIS — R9439 Abnormal result of other cardiovascular function study: Secondary | ICD-10-CM | POA: Diagnosis not present

## 2019-05-22 DIAGNOSIS — I119 Hypertensive heart disease without heart failure: Secondary | ICD-10-CM | POA: Insufficient documentation

## 2019-05-22 DIAGNOSIS — I080 Rheumatic disorders of both mitral and aortic valves: Secondary | ICD-10-CM | POA: Diagnosis not present

## 2019-05-22 DIAGNOSIS — Z87891 Personal history of nicotine dependence: Secondary | ICD-10-CM | POA: Insufficient documentation

## 2019-05-22 DIAGNOSIS — E785 Hyperlipidemia, unspecified: Secondary | ICD-10-CM | POA: Insufficient documentation

## 2019-05-22 DIAGNOSIS — J449 Chronic obstructive pulmonary disease, unspecified: Secondary | ICD-10-CM | POA: Diagnosis not present

## 2019-05-22 DIAGNOSIS — R002 Palpitations: Secondary | ICD-10-CM | POA: Diagnosis not present

## 2019-05-22 LAB — MYOCARDIAL PERFUSION IMAGING
LV dias vol: 81 mL (ref 62–150)
LV sys vol: 31 mL
Peak HR: 91 {beats}/min
Rest HR: 81 {beats}/min
SDS: 2
SRS: 0
SSS: 2
TID: 0.9

## 2019-05-22 LAB — ECHOCARDIOGRAM COMPLETE
Height: 72 in
Weight: 2624 oz

## 2019-05-22 MED ORDER — TECHNETIUM TC 99M TETROFOSMIN IV KIT
9.8000 | PACK | Freq: Once | INTRAVENOUS | Status: AC | PRN
  Administered 2019-05-22: 9.8 via INTRAVENOUS
  Filled 2019-05-22: qty 10

## 2019-05-22 MED ORDER — TECHNETIUM TC 99M TETROFOSMIN IV KIT
30.3000 | PACK | Freq: Once | INTRAVENOUS | Status: AC | PRN
  Administered 2019-05-22: 30.3 via INTRAVENOUS
  Filled 2019-05-22: qty 31

## 2019-05-22 MED ORDER — REGADENOSON 0.4 MG/5ML IV SOLN
0.4000 mg | Freq: Once | INTRAVENOUS | Status: AC
  Administered 2019-05-22: 0.4 mg via INTRAVENOUS

## 2019-05-23 ENCOUNTER — Telehealth: Payer: Self-pay

## 2019-05-23 NOTE — Telephone Encounter (Signed)
Call to patient to review labs.    Pt verbalized understanding and has no further questions at this time.    Advised pt to call for any further questions or concerns.  No further orders.   

## 2019-05-23 NOTE — Telephone Encounter (Signed)
-----   Message from Duke Salvia, MD sent at 05/22/2019  9:43 PM EDT ----- Please Inform Patient Echo showed  normal# heart muscle function    Great news

## 2019-06-06 ENCOUNTER — Telehealth: Payer: Self-pay

## 2019-06-06 MED ORDER — CARVEDILOL 12.5 MG PO TABS: 18.7500 mg | ORAL_TABLET | Freq: Two times a day (BID) | ORAL | 3 refills | Status: DC

## 2019-06-06 NOTE — Telephone Encounter (Signed)
Call to patient to review stress test results.    Pt verbalized understanding and has no further questions at this time.    Advised pt to call for any further questions or concerns.  rx updated per request.

## 2019-06-06 NOTE — Telephone Encounter (Signed)
-----   Message from Duke Salvia, MD sent at 06/04/2019  8:38 PM EDT ----- Please Inform Patient that stress test showed no evidence of ischemia, normal heart muscle function # but very high bp with exercise   lets have him increase his carvedilol from 12.5 >>18.75 bid     Thanks

## 2019-06-30 ENCOUNTER — Ambulatory Visit (INDEPENDENT_AMBULATORY_CARE_PROVIDER_SITE_OTHER): Payer: Medicare HMO | Admitting: *Deleted

## 2019-06-30 DIAGNOSIS — I442 Atrioventricular block, complete: Secondary | ICD-10-CM | POA: Diagnosis not present

## 2019-06-30 LAB — CUP PACEART REMOTE DEVICE CHECK
Battery Remaining Longevity: 120 mo
Battery Remaining Percentage: 95.5 %
Battery Voltage: 3.01 V
Brady Statistic AP VP Percent: 1 %
Brady Statistic AP VS Percent: 1 %
Brady Statistic AS VP Percent: 69 %
Brady Statistic AS VS Percent: 31 %
Brady Statistic RA Percent Paced: 1 %
Brady Statistic RV Percent Paced: 69 %
Date Time Interrogation Session: 20210503003035
Implantable Lead Implant Date: 20200108
Implantable Lead Implant Date: 20200108
Implantable Lead Location: 753859
Implantable Lead Location: 753860
Implantable Lead Model: 5076
Implantable Lead Model: 5076
Implantable Pulse Generator Implant Date: 20200108
Lead Channel Impedance Value: 360 Ohm
Lead Channel Impedance Value: 400 Ohm
Lead Channel Pacing Threshold Amplitude: 0.75 V
Lead Channel Pacing Threshold Amplitude: 1 V
Lead Channel Pacing Threshold Pulse Width: 0.4 ms
Lead Channel Pacing Threshold Pulse Width: 0.5 ms
Lead Channel Sensing Intrinsic Amplitude: 3.4 mV
Lead Channel Sensing Intrinsic Amplitude: 7.6 mV
Lead Channel Setting Pacing Amplitude: 1.25 V
Lead Channel Setting Pacing Amplitude: 2 V
Lead Channel Setting Pacing Pulse Width: 0.4 ms
Lead Channel Setting Sensing Sensitivity: 4 mV
Pulse Gen Model: 2272
Pulse Gen Serial Number: 9091999

## 2019-07-01 NOTE — Progress Notes (Signed)
Remote pacemaker transmission.   

## 2019-08-13 ENCOUNTER — Other Ambulatory Visit: Payer: Self-pay

## 2019-09-29 ENCOUNTER — Ambulatory Visit (INDEPENDENT_AMBULATORY_CARE_PROVIDER_SITE_OTHER): Payer: Medicare HMO | Admitting: *Deleted

## 2019-09-29 DIAGNOSIS — I442 Atrioventricular block, complete: Secondary | ICD-10-CM | POA: Diagnosis not present

## 2019-09-30 LAB — CUP PACEART REMOTE DEVICE CHECK
Battery Remaining Longevity: 121 mo
Battery Remaining Percentage: 95.5 %
Battery Voltage: 3.01 V
Brady Statistic AP VP Percent: 1 %
Brady Statistic AP VS Percent: 1 %
Brady Statistic AS VP Percent: 83 %
Brady Statistic AS VS Percent: 17 %
Brady Statistic RA Percent Paced: 1 %
Brady Statistic RV Percent Paced: 83 %
Date Time Interrogation Session: 20210802020014
Implantable Lead Implant Date: 20200108
Implantable Lead Implant Date: 20200108
Implantable Lead Location: 753859
Implantable Lead Location: 753860
Implantable Lead Model: 5076
Implantable Lead Model: 5076
Implantable Pulse Generator Implant Date: 20200108
Lead Channel Impedance Value: 360 Ohm
Lead Channel Impedance Value: 430 Ohm
Lead Channel Pacing Threshold Amplitude: 0.75 V
Lead Channel Pacing Threshold Amplitude: 0.875 V
Lead Channel Pacing Threshold Pulse Width: 0.4 ms
Lead Channel Pacing Threshold Pulse Width: 0.5 ms
Lead Channel Sensing Intrinsic Amplitude: 5 mV
Lead Channel Sensing Intrinsic Amplitude: 7.4 mV
Lead Channel Setting Pacing Amplitude: 1.125
Lead Channel Setting Pacing Amplitude: 2 V
Lead Channel Setting Pacing Pulse Width: 0.4 ms
Lead Channel Setting Sensing Sensitivity: 4 mV
Pulse Gen Model: 2272
Pulse Gen Serial Number: 9091999

## 2019-10-02 NOTE — Progress Notes (Signed)
Remote pacemaker transmission.   

## 2019-12-29 ENCOUNTER — Ambulatory Visit (INDEPENDENT_AMBULATORY_CARE_PROVIDER_SITE_OTHER): Payer: Medicare HMO

## 2019-12-29 DIAGNOSIS — I442 Atrioventricular block, complete: Secondary | ICD-10-CM | POA: Diagnosis not present

## 2019-12-30 LAB — CUP PACEART REMOTE DEVICE CHECK
Battery Remaining Longevity: 124 mo
Battery Remaining Percentage: 95.5 %
Battery Voltage: 3.01 V
Brady Statistic AP VP Percent: 1 %
Brady Statistic AP VS Percent: 1 %
Brady Statistic AS VP Percent: 86 %
Brady Statistic AS VS Percent: 13 %
Brady Statistic RA Percent Paced: 1 %
Brady Statistic RV Percent Paced: 87 %
Date Time Interrogation Session: 20211101020013
Implantable Lead Implant Date: 20200108
Implantable Lead Implant Date: 20200108
Implantable Lead Location: 753859
Implantable Lead Location: 753860
Implantable Lead Model: 5076
Implantable Lead Model: 5076
Implantable Pulse Generator Implant Date: 20200108
Lead Channel Impedance Value: 350 Ohm
Lead Channel Impedance Value: 480 Ohm
Lead Channel Pacing Threshold Amplitude: 0.625 V
Lead Channel Pacing Threshold Amplitude: 0.75 V
Lead Channel Pacing Threshold Pulse Width: 0.4 ms
Lead Channel Pacing Threshold Pulse Width: 0.5 ms
Lead Channel Sensing Intrinsic Amplitude: 4 mV
Lead Channel Sensing Intrinsic Amplitude: 6.3 mV
Lead Channel Setting Pacing Amplitude: 0.875
Lead Channel Setting Pacing Amplitude: 2 V
Lead Channel Setting Pacing Pulse Width: 0.4 ms
Lead Channel Setting Sensing Sensitivity: 4 mV
Pulse Gen Model: 2272
Pulse Gen Serial Number: 9091999

## 2020-01-01 NOTE — Progress Notes (Signed)
Remote pacemaker transmission.   

## 2020-03-29 ENCOUNTER — Ambulatory Visit (INDEPENDENT_AMBULATORY_CARE_PROVIDER_SITE_OTHER): Payer: Medicare HMO

## 2020-03-29 DIAGNOSIS — I442 Atrioventricular block, complete: Secondary | ICD-10-CM

## 2020-03-29 LAB — CUP PACEART REMOTE DEVICE CHECK
Battery Remaining Longevity: 125 mo
Battery Remaining Percentage: 95.5 %
Battery Voltage: 3.01 V
Brady Statistic AP VP Percent: 1 %
Brady Statistic AP VS Percent: 1 %
Brady Statistic AS VP Percent: 87 %
Brady Statistic AS VS Percent: 12 %
Brady Statistic RA Percent Paced: 1 %
Brady Statistic RV Percent Paced: 88 %
Date Time Interrogation Session: 20220131033254
Implantable Lead Implant Date: 20200108
Implantable Lead Implant Date: 20200108
Implantable Lead Location: 753859
Implantable Lead Location: 753860
Implantable Lead Model: 5076
Implantable Lead Model: 5076
Implantable Pulse Generator Implant Date: 20200108
Lead Channel Impedance Value: 330 Ohm
Lead Channel Impedance Value: 530 Ohm
Lead Channel Pacing Threshold Amplitude: 0.625 V
Lead Channel Pacing Threshold Amplitude: 0.75 V
Lead Channel Pacing Threshold Pulse Width: 0.4 ms
Lead Channel Pacing Threshold Pulse Width: 0.5 ms
Lead Channel Sensing Intrinsic Amplitude: 3.8 mV
Lead Channel Sensing Intrinsic Amplitude: 6.3 mV
Lead Channel Setting Pacing Amplitude: 0.875
Lead Channel Setting Pacing Amplitude: 2 V
Lead Channel Setting Pacing Pulse Width: 0.4 ms
Lead Channel Setting Sensing Sensitivity: 4 mV
Pulse Gen Model: 2272
Pulse Gen Serial Number: 9091999

## 2020-04-07 NOTE — Progress Notes (Signed)
Remote pacemaker transmission.   

## 2020-04-28 ENCOUNTER — Ambulatory Visit: Payer: Medicare HMO | Admitting: Internal Medicine

## 2020-04-28 ENCOUNTER — Encounter: Payer: Self-pay | Admitting: Internal Medicine

## 2020-04-28 ENCOUNTER — Other Ambulatory Visit: Payer: Self-pay

## 2020-04-28 VITALS — BP 138/78 | HR 69 | Ht 72.0 in | Wt 196.0 lb

## 2020-04-28 DIAGNOSIS — I442 Atrioventricular block, complete: Secondary | ICD-10-CM

## 2020-04-28 DIAGNOSIS — Z950 Presence of cardiac pacemaker: Secondary | ICD-10-CM

## 2020-04-28 NOTE — Progress Notes (Signed)
Patient Care Team: Pcp, No as PCP - General   HPI  Eric Diaz is a 75 y.o. 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  He developed high ventricular pacing thresholds. Underwent lead revision 2 weeks later   Hospitalized 12/20 with pneumonia and sepsis complicated by acute renal failure with subsequent improvement blood cultures were negative x2  No complaints of dyspnea either with exertion or nocturnally, no edema.  Does however have recurrent falls.  Significant lightheadedness upon standing particularly from the car.  Surprisingly not with his showers or the bathroom.  Fatigue has been relentless and wife dates to the time of pacemaker   Carvedilol by nowstarted at that time  No sleep disordered breathing       DATE TEST EF   1/20 Echo   55-60 %   3/21 Echo  60-65%    Date Cr K Hgb  1/20 0.8 3.9 13.7  12/20 2.9>>0.7 3.5 10.4             Records and Results Reviewed   Past Medical History:  Diagnosis Date  . COPD (chronic obstructive pulmonary disease) (HCC)   . High cholesterol   . Hypertension     Past Surgical History:  Procedure Laterality Date  . KNEE ARTHROSCOPY     left  . LEAD REVISION/REPAIR N/A 03/25/2018   Procedure: LEAD REVISION/REPAIR;  Surgeon: Duke Salvia, MD;  Location: [facility] INVASIVE CV LAB;  Service: Cardiovascular;  Laterality: N/A;  . PACEMAKER IMPLANT N/A 03/06/2018   Procedure: PACEMAKER IMPLANT;  Surgeon: Duke Salvia, MD;  Location: [facility] INVASIVE CV LAB;  Service: Cardiovascular;  Laterality: N/A;    Current Meds  Medication Sig  . acetaminophen (TYLENOL) 500 MG tablet Take 1,000 mg by mouth 2 (two) times daily.  Marland Kitchen albuterol (VENTOLIN HFA) 108 (90 Base) MCG/ACT inhaler Inhale 1 puff into the lungs every 6 (six) hours as needed for wheezing or shortness of breath. Plz see PCP for additional refills.  Marland Kitchen aspirin EC 81 MG tablet Take 81 mg by mouth daily.  Marland Kitchen atorvastatin  (LIPITOR) 40 MG tablet Take 40 mg by mouth daily.  . carvedilol (COREG) 12.5 MG tablet Take 1.5 tablets (18.75 mg total) by mouth 2 (two) times daily.  . Cyanocobalamin (B-12 PO) Take 1 tablet by mouth daily.  . feeding supplement, ENSURE ENLIVE, (ENSURE ENLIVE) LIQD Take 237 mLs by mouth 2 (two) times daily between meals.  Marland Kitchen ibuprofen (ADVIL,MOTRIN) 200 MG tablet Take 200 mg by mouth 2 (two) times daily.  . Multiple Vitamin (MULTIVITAMIN WITH MINERALS) TABS tablet Take 1 tablet by mouth daily.  . predniSONE (DELTASONE) 10 MG tablet Take 10 mg by mouth daily.  . TRELEGY ELLIPTA 100-62.5-25 MCG/INH AEPB Inhale 1 puff into the lungs daily.  Monte Fantasia INHUB 250-50 MCG/DOSE AEPB Inhale 1 puff into the lungs daily.     Allergies  Allergen Reactions  . Bee Venom Anaphylaxis and Swelling      Review of Systems negative except from HPI and PMH  Physical Exam BP 138/78   Pulse 69   Ht 6' (1.829 m)   Wt 196 lb (88.9 kg)   SpO2 95%   BMI 26.58 kg/m  Well developed and well nourished in no acute distress HENT normal Neck supple with JVP-flat Clear Device pocket well healed; without hematoma or erythema.  There is no tethering  Regular rate and rhythm, no murmur Abd-soft with active BS  No Clubbing cyanosis   edema Skin-warm and dry A & Oriented  Grossly normal sensory and motor function  ECG sinus @ 69 18/13/43   Assessment and  Plan  CHB-high-grade   Syncope  Hypertension  Dyspnea on exertion  Pacemaker-Saint Jude status post RV lead revision  Orthostatic falls   Blood pressure well controlled but history of significant orthostatic falls occurring weekly and presyncope even more often than that.  Surprisingly does not have heat intolerance. And have surprised that orthostatic vital signs are unrevealing.  Still, have reviewed maneuvers of isometric contraction and discussed the potential role of compression.  The fatigue is a big concern to him and his wife.  I  wonder whether the carvedilol concurrent with this symptoms that initiated the time of his pacemaker we will wean it off.  Blood pressures at home have been in the 120-30 range.  Today they are 170+.  We will need to track his blood pressure with the discontinuation of carvedilol but first we need to find out whether the carvedilol is responsible for his fatigue  Dyspnea on exertion is improved          No interval atrial fibrillation  Euvolemic continue current meds   Current medicines are reviewed at length with the patient today .  The patient does not  have concerns regarding medicines.

## 2020-04-28 NOTE — Patient Instructions (Addendum)
Medication Instructions:  Your physician has recommended you make the following change in your medication:  ** Decrease your Carvedilol to 1/2 tablet by mouth twice daily x 5 days then stop medication *If you need a refill on your cardiac medications before your next appointment, please call your pharmacy*   Lab Work: None ordered   If you have labs (blood work) drawn today and your tests are completely normal, you will receive your results only by: Marland Kitchen MyChart Message (if you have MyChart) OR . A paper copy in the mail If you have any lab test that is abnormal or we need to change your treatment, we will call you to review the results.   Testing/Procedures: None ordered.    Follow-Up: At [facility], you and your health needs are our priority.  As part of our continuing mission to provide you with exceptional heart care, we have created designated Provider Care Teams.  These Care Teams include your primary Cardiologist (physician) and Advanced Practice Providers (APPs -  Physician Assistants and Nurse Practitioners) who all work together to provide you with the care you need, when you need it.  We recommend signing up for the patient portal called "MyChart".  Sign up information is provided on this After Visit Summary.  MyChart is used to connect with patients for Virtual Visits (Telemedicine).  Patients are able to view lab/test results, encounter notes, upcoming appointments, etc.  Non-urgent messages can be sent to your provider as well.   To learn more about what you can do with MyChart, go to [email or web address].    Your next appointment:   12 month(s)  The format for your next appointment:   In Person  Provider:   Sherryl Manges, MD

## 2020-06-01 ENCOUNTER — Telehealth: Payer: Self-pay

## 2020-06-01 NOTE — Telephone Encounter (Signed)
Dr. Charlcie Cradle asked if the patient pacemaker was MRI compatible? I told him it was not MRI compatible because he has St. Jude pacemaker and [employer] leads.

## 2020-06-28 ENCOUNTER — Ambulatory Visit (INDEPENDENT_AMBULATORY_CARE_PROVIDER_SITE_OTHER): Payer: Medicare HMO

## 2020-06-28 DIAGNOSIS — I442 Atrioventricular block, complete: Secondary | ICD-10-CM | POA: Diagnosis not present

## 2020-06-29 ENCOUNTER — Telehealth: Payer: Self-pay | Admitting: Internal Medicine

## 2020-06-29 NOTE — Telephone Encounter (Signed)
Spoke with pt's wife, DPR who reports they are in [place].  Pt currently not feeling well with congestion and runny nose.  Pt's wife is concerned because pt's HR has been running between 100-114 and B/P has been elevated since stopping his BP medication at his last visit with Dr Graciela Husbands.  Has followed up with PCP re: BP. Unable to send a remote transmission due to not traveling with monitor. Pt's wife states she and pt are on their way to an UC center now to have pt's pt's symptoms evaluated.  Will contact office to schedule f/u appointment if needed.

## 2020-06-29 NOTE — Telephone Encounter (Signed)
Patients wife called and says that they are out of town. Says that patient is experiencing high bp and a high heart rate. Wants to know if he the predniSONE (DELTASONE) 10 MG tablet. Patient feels really back. Avg heart rate is between 100-114; last bp reading 186/81; 114

## 2020-07-07 LAB — CUP PACEART REMOTE DEVICE CHECK
Battery Remaining Longevity: 124 mo
Battery Remaining Percentage: 95.5 %
Battery Voltage: 3.01 V
Brady Statistic AP VP Percent: 1 %
Brady Statistic AP VS Percent: 1 %
Brady Statistic AS VP Percent: 74 %
Brady Statistic AS VS Percent: 26 %
Brady Statistic RA Percent Paced: 1 %
Brady Statistic RV Percent Paced: 74 %
Date Time Interrogation Session: 20220508015022
Implantable Lead Implant Date: 20200108
Implantable Lead Implant Date: 20200108
Implantable Lead Location: 753859
Implantable Lead Location: 753860
Implantable Lead Model: 5076
Implantable Lead Model: 5076
Implantable Pulse Generator Implant Date: 20200108
Lead Channel Impedance Value: 360 Ohm
Lead Channel Impedance Value: 590 Ohm
Lead Channel Pacing Threshold Amplitude: 0.5 V
Lead Channel Pacing Threshold Amplitude: 0.75 V
Lead Channel Pacing Threshold Pulse Width: 0.4 ms
Lead Channel Pacing Threshold Pulse Width: 0.5 ms
Lead Channel Sensing Intrinsic Amplitude: 5 mV
Lead Channel Sensing Intrinsic Amplitude: 5.8 mV
Lead Channel Setting Pacing Amplitude: 0.75 V
Lead Channel Setting Pacing Amplitude: 2 V
Lead Channel Setting Pacing Pulse Width: 0.4 ms
Lead Channel Setting Sensing Sensitivity: 3 mV
Pulse Gen Model: 2272
Pulse Gen Serial Number: 9091999

## 2020-07-20 NOTE — Progress Notes (Signed)
Remote pacemaker transmission.   

## 2020-08-22 ENCOUNTER — Encounter (HOSPITAL_COMMUNITY): Payer: Self-pay | Admitting: Emergency Medicine

## 2020-08-22 ENCOUNTER — Other Ambulatory Visit: Payer: Self-pay

## 2020-08-22 ENCOUNTER — Emergency Department (HOSPITAL_COMMUNITY): Payer: Medicare HMO

## 2020-08-22 ENCOUNTER — Emergency Department (HOSPITAL_COMMUNITY)
Admission: EM | Admit: 2020-08-22 | Discharge: 2020-08-22 | Disposition: A | Discharge status: Home / Self Care | Payer: Medicare HMO | Attending: Emergency Medicine | Admitting: Emergency Medicine

## 2020-08-22 DIAGNOSIS — R0602 Shortness of breath: Secondary | ICD-10-CM | POA: Diagnosis present

## 2020-08-22 DIAGNOSIS — I10 Essential (primary) hypertension: Secondary | ICD-10-CM | POA: Diagnosis not present

## 2020-08-22 DIAGNOSIS — F17210 Nicotine dependence, cigarettes, uncomplicated: Secondary | ICD-10-CM | POA: Insufficient documentation

## 2020-08-22 DIAGNOSIS — Z7982 Long term (current) use of aspirin: Secondary | ICD-10-CM | POA: Diagnosis not present

## 2020-08-22 DIAGNOSIS — J069 Acute upper respiratory infection, unspecified: Secondary | ICD-10-CM | POA: Insufficient documentation

## 2020-08-22 DIAGNOSIS — J441 Chronic obstructive pulmonary disease with (acute) exacerbation: Secondary | ICD-10-CM | POA: Diagnosis not present

## 2020-08-22 DIAGNOSIS — Z950 Presence of cardiac pacemaker: Secondary | ICD-10-CM | POA: Diagnosis not present

## 2020-08-22 DIAGNOSIS — Z20822 Contact with and (suspected) exposure to covid-19: Secondary | ICD-10-CM | POA: Insufficient documentation

## 2020-08-22 LAB — COMPREHENSIVE METABOLIC PANEL
ALT: 54 U/L — ABNORMAL HIGH (ref 0–44)
AST: 38 U/L (ref 15–41)
Albumin: 3.8 g/dL (ref 3.5–5.0)
Alkaline Phosphatase: 70 U/L (ref 38–126)
Anion gap: 9 (ref 5–15)
BUN: 10 mg/dL (ref 8–23)
CO2: 27 mmol/L (ref 22–32)
Calcium: 9.1 mg/dL (ref 8.9–10.3)
Chloride: 96 mmol/L — ABNORMAL LOW (ref 98–111)
Creatinine, Ser: 0.72 mg/dL (ref 0.61–1.24)
GFR, Estimated: >60 mL/min (ref >60)
Glucose, Bld: 131 mg/dL — ABNORMAL HIGH (ref 70–99)
Potassium: 4.6 mmol/L (ref 3.5–5.1)
Sodium: 132 mmol/L — ABNORMAL LOW (ref 135–145)
Total Bilirubin: 0.7 mg/dL (ref 0.3–1.2)
Total Protein: 7 g/dL (ref 6.5–8.1)

## 2020-08-22 LAB — CBC WITH DIFFERENTIAL/PLATELET
Abs Immature Granulocytes: 0.23 10*3/uL — ABNORMAL HIGH (ref 0.00–0.07)
Basophils Absolute: 0.1 10*3/uL (ref 0.0–0.1)
Basophils Relative: 0 %
Eosinophils Absolute: 0 10*3/uL (ref 0.0–0.5)
Eosinophils Relative: 0 %
HCT: 44.1 % (ref 39.0–52.0)
Hemoglobin: 15.3 g/dL (ref 13.0–17.0)
Immature Granulocytes: 1 %
Lymphocytes Relative: 3 %
Lymphs Abs: 0.8 10*3/uL (ref 0.7–4.0)
MCH: 35.4 pg — ABNORMAL HIGH (ref 26.0–34.0)
MCHC: 34.7 g/dL (ref 30.0–36.0)
MCV: 102.1 fL — ABNORMAL HIGH (ref 80.0–100.0)
Monocytes Absolute: 1.1 10*3/uL — ABNORMAL HIGH (ref 0.1–1.0)
Monocytes Relative: 5 %
Neutro Abs: 20.3 10*3/uL — ABNORMAL HIGH (ref 1.7–7.7)
Neutrophils Relative %: 91 %
Platelets: 272 10*3/uL (ref 150–400)
RBC: 4.32 MIL/uL (ref 4.22–5.81)
RDW: 11.9 % (ref 11.5–15.5)
WBC: 22.5 10*3/uL — ABNORMAL HIGH (ref 4.0–10.5)
nRBC: 0 % (ref 0.0–0.2)

## 2020-08-22 LAB — RESP PANEL BY RT-PCR (FLU A&B, COVID) ARPGX2
Influenza A by PCR: NEGATIVE
Influenza B by PCR: NEGATIVE
SARS Coronavirus 2 by RT PCR: NEGATIVE

## 2020-08-22 LAB — BRAIN NATRIURETIC PEPTIDE: B Natriuretic Peptide: 151 pg/mL — ABNORMAL HIGH (ref 0.0–100.0)

## 2020-08-22 LAB — TROPONIN I (HIGH SENSITIVITY)
Troponin I (High Sensitivity): 8 ng/L (ref <18)
Troponin I (High Sensitivity): 8 ng/L (ref <18)

## 2020-08-22 MED ORDER — PREDNISONE 20 MG PO TABS: 40.0000 mg | ORAL_TABLET | Freq: Every day | ORAL | 0 refills | Status: DC

## 2020-08-22 MED ORDER — PREDNISONE 20 MG PO TABS
40.0000 mg | ORAL_TABLET | Freq: Once | ORAL | Status: AC
  Administered 2020-08-22: 40 mg via ORAL
  Filled 2020-08-22: qty 2

## 2020-08-22 MED ORDER — DOXYCYCLINE HYCLATE 100 MG PO CAPS: 100.0000 mg | ORAL_CAPSULE | Freq: Two times a day (BID) | ORAL | 0 refills | Status: DC

## 2020-08-22 MED ORDER — ALBUTEROL SULFATE HFA 108 (90 BASE) MCG/ACT IN AERS
2.0000 | INHALATION_SPRAY | RESPIRATORY_TRACT | Status: DC | PRN
  Administered 2020-08-22: 2 via RESPIRATORY_TRACT
  Filled 2020-08-22: qty 6.7

## 2020-08-22 MED ORDER — GUAIFENESIN-DM 100-10 MG/5ML PO SYRP: 5.0000 mL | ORAL_SOLUTION | Freq: Three times a day (TID) | ORAL | 0 refills | Status: DC | PRN

## 2020-08-22 NOTE — ED Notes (Signed)
Pt placed on room air trial and noted with O2 sats at 95%. Ambulated without O2 to restroom, sats dropped 89% on room air and recovered to 97% when 1.5 L/min supplemental oxygen applied via nasal cannula.

## 2020-08-22 NOTE — ED Notes (Signed)
Pt ambulated 79ft on room air on continuous pulse oximeter with sats varying between 90-92%.

## 2020-08-22 NOTE — ED Triage Notes (Signed)
Patient c/o shortness of breath that started x3 days ago. Per patient occasional cough. Patient states hx of COPD. Patient uses neb treatments and inhalers. Patient last used inhaler last 1.5 hours ago with little improvement. Denies any chest pain or fevers.

## 2020-08-22 NOTE — ED Provider Notes (Signed)
[facility] EMERGENCY DEPARTMENT Provider Note   CSN: [id number] Arrival date & time: 08/22/20  1555     History Chief Complaint  Patient presents with   Shortness of Breath    Eric Diaz is a 75 y.o. male.   Shortness of Breath Associated symptoms: cough   Associated symptoms: no abdominal pain, no chest pain and no rash   Patient with history of COPD.  Presents with shortness of breath.  Has had shortness of breath and cough for around the last 3 days.  States he has been coughing chronically though with his COPD and actually is coughing up less sputum than he was before.  Found to be hypoxic with sats at 87.  Uses inhalers at home.  States he has uses rescue inhaler around 3-4 times a day typically.    Past Medical History:  Diagnosis Date   COPD (chronic obstructive pulmonary disease) (HCC)    High cholesterol    Hypertension     Patient Active Problem List   Diagnosis Date Noted   Pacemaker 04/01/2019   Lobar pneumonia (HCC) 02/17/2019   Sepsis due to undetermined organism (HCC) 02/16/2019   Sepsis (HCC) 02/15/2019   Acute exacerbation of chronic obstructive pulmonary disease (COPD) (HCC) 02/15/2019   CAP (community acquired pneumonia) 02/15/2019   AKI (acute kidney injury) (HCC) 02/15/2019   Acute respiratory failure with hypoxia (HCC) 02/15/2019   Elevated troponin 02/15/2019   Essential hypertension    Complete heart block (HCC)    SOB (shortness of breath)    Acute heart failure (HCC)    Heart block 03/05/2018   AV block, 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 No pertinent family history.  Social History   Tobacco Use   Smoking status: Every Day    Packs/day: 1.00    Pack years: 0.00    Types: Cigarettes   Smokeless tobacco: Never  Vaping Use   Vaping Use: Never used  Substance Use Topics   Alcohol use: Yes    Comment: occasionally   Drug use: No    Home Medications Prior to Admission medications   Medication Sig Start Date End Date Taking? Authorizing Provider  acetaminophen (TYLENOL) 500 MG tablet Take 1,000 mg by mouth 2 (two) times daily.   Yes [provider]  albuterol (VENTOLIN HFA) 108 (90 Base) MCG/ACT inhaler Inhale 1 puff into the lungs every 6 (six) hours as needed for wheezing or shortness of breath. Plz see PCP for additional refills. 11/22/18  Yes Duke Salvia, MD  Albuterol Sulfate 2.5 MG/0.5ML NEBU Inhale 2.5 mg into the lungs 2 (two) times daily as needed (shortness of breath).   Yes [provider]  aspirin EC 81 MG tablet Take 81 mg by mouth daily.   Yes [provider]  atorvastatin (LIPITOR) 40 MG tablet Take 40 mg by mouth daily. 01/16/20  Yes [provider]  carvedilol (COREG) 12.5 MG tablet Take 1.5 tablets (18.75 mg total) by mouth 2 (two) times daily. 06/06/19  Yes Duke Salvia, MD  Cyanocobalamin (B-12 PO) Take 1 tablet by mouth daily.   Yes [provider]  doxycycline (VIBRAMYCIN) 100 MG capsule Take 1 capsule (100 mg total) by mouth 2 (two) times daily. 08/22/20  Yes Benjiman Core, MD  feeding supplement, ENSURE ENLIVE, (ENSURE ENLIVE) LIQD Take 237 mLs by mouth 2 (two) times daily between meals. 02/18/19  Yes Tat, Onalee Hua, MD  guaiFENesin-dextromethorphan (ROBITUSSIN DM) 100-10 MG/5ML syrup Take 5 mLs by mouth 3 (three) times daily as needed for cough. 08/22/20  Yes Benjiman Core, MD  ibuprofen (ADVIL,MOTRIN) 200 MG tablet Take 200 mg by mouth 2 (two) times daily.   Yes [provider]   Multiple Vitamin (MULTIVITAMIN WITH MINERALS) TABS tablet Take 1 tablet by mouth daily.   Yes [provider]  predniSONE (DELTASONE) 20 MG tablet Take 2 tablets (40 mg total) by mouth daily. 08/22/20  Yes Benjiman Core, MD  TRELEGY ELLIPTA 100-62.5-25 MCG/INH AEPB Inhale 1 puff into the lungs daily. 04/06/20  Yes [provider]  doxycycline (ADOXA) 100 MG tablet Take 100 mg by mouth 2 (two) times daily. Patient not taking: No sig reported 08/03/20   [provider]  Monte Fantasia INHUB 250-50 MCG/DOSE AEPB Inhale 1 puff into the lungs daily. Patient not taking: Reported on 08/22/2020 03/16/18   [provider]    Allergies    Bee venom  Review of Systems   Review of Systems  Constitutional:  Negative for appetite change.  HENT:  Negative for congestion.   Respiratory:  Positive for cough and shortness of breath.   Cardiovascular:  Negative for chest pain.  Gastrointestinal:  Negative for abdominal pain.  Genitourinary:  Negative for flank pain.  Musculoskeletal:  Negative for back pain.  Skin:  Negative for rash.  Neurological:  Negative for weakness.  Psychiatric/Behavioral:  Negative for confusion.    Physical Exam Updated Vital Signs BP (!) 170/80   Pulse 89   Temp 98.1 F (36.7 C) (Oral)   Resp (!) 22   Ht 6' (1.829 m)   Wt 87.1 kg   SpO2 94%   BMI 26.04 kg/m   Physical Exam Vitals and nursing note reviewed.  Cardiovascular:     Rate and Rhythm: Normal rate.  Pulmonary:     Breath sounds: Wheezing present.     Comments: Some wheezes and prolonged expirations bilaterally. Chest:     Chest wall: No tenderness.  Abdominal:     Tenderness: There is no abdominal tenderness.  Musculoskeletal:     Right lower leg: No edema.     Left lower leg: No edema.  Skin:    General: Skin is warm.     Capillary Refill: Capillary refill takes less than 2 seconds.  Neurological:     Mental Status: He is alert and oriented to person, place, and  time.    ED Results / Procedures / Treatments   Labs (all labs ordered are listed, but only abnormal results are displayed) Labs Reviewed  COMPREHENSIVE METABOLIC PANEL - Abnormal; Notable for the following components:      Result Value   Sodium 132 (*)    Chloride 96 (*)    Glucose, Bld 131 (*)    ALT 54 (*)    All other components within normal limits  BRAIN NATRIURETIC PEPTIDE - Abnormal; Notable for the following components:   B Natriuretic Peptide 151.0 (*)    All other components within normal limits  CBC WITH DIFFERENTIAL/PLATELET - Abnormal; Notable  for the following components:   WBC 22.5 (*)    MCV 102.1 (*)    MCH 35.4 (*)    Neutro Abs 20.3 (*)    Monocytes Absolute 1.1 (*)    Abs Immature Granulocytes 0.23 (*)    All other components within normal limits  RESP PANEL BY RT-PCR (FLU A&B, COVID) ARPGX2  TROPONIN I (HIGH SENSITIVITY)  TROPONIN I (HIGH SENSITIVITY)    EKG EKG Interpretation  Date/Time:  "Sunday August 22 2020 19:04:40 EDT Ventricular Rate:  90 PR Interval:  201 QRS Duration: 143 QT Interval:  436 QTC Calculation: 534 R Axis:   -82 Text Interpretation: atrial sensed ventricular pacing Confirmed by Ossie Yebra ([id number]) on 08/22/2020 7:10:19 PM  Radiology DG Chest Portable 1 View  Result Date: 08/22/2020 CLINICAL DATA:  Shortness of breath. EXAM: PORTABLE CHEST 1 VIEW COMPARISON:  February 15, 2019 FINDINGS: Stable pacemaker. No pneumothorax. The cardiomediastinal silhouette is stable. No acute infiltrate, nodule, or mass. Mild interstitial prominence is stable, likely bronchitic change. No other acute abnormalities. IMPRESSION: No acute abnormalities to explain the patient's shortness of breath. No interval changes. Electronically Signed   By: David  Williams III M.D   On: 08/22/2020 16:54    Procedures Procedures   Medications Ordered in ED Medications  albuterol (VENTOLIN HFA) 108 (90 Base) MCG/ACT inhaler 2 puff (2 puffs Inhalation Given  08/22/20 1833)  predniSONE (DELTASONE) tablet 40 mg (40 mg Oral Given 08/22/20 2053)    ED Course  I have reviewed the triage vital signs and the nursing notes.  Pertinent labs & imaging results that were available during my care of the patient were reviewed by me and considered in my medical decision making (see chart for details).    MDM Rules/Calculators/A&P                          Patient with shortness of breath and cough.  Had symptoms for last 3 days.  Negative COVID test.  History of COPD.  Likely viral infection potentially COPD exacerbation.  Increase sputum production while in the ER.  Initially hypoxic but improved after treatment.  Able ambulate without hypoxia.  Appears stable for discharge home.  Will treat with steroids and antibiotics.  Outpatient follow-up with PCP Final Clinical Impression(s) / ED Diagnoses Final diagnoses:  Upper respiratory tract infection, unspecified type    Rx / DC Orders ED Discharge Orders          Ordered    predniSONE (DELTASONE) 20 MG tablet  Daily        08/22/20 2048    guaiFENesin-dextromethorphan (ROBITUSSIN DM) 100-10 MG/5ML syrup  3 times daily PRN        08/22/20 2048    doxycycline (VIBRAMYCIN) 100 MG capsule  2 times daily        06" /26/22 2048             04-22-1988, MD 08/23/20 [id number]

## 2020-08-22 NOTE — ED Notes (Signed)
Pt complaining of increased SOB, bilateral expiratory wheezes noted. Pt requesting prn albuterol.

## 2020-08-22 NOTE — ED Notes (Signed)
Patient O2 sat 87% on room air. Patient palced on 2L of O2 via [place] and instructed to breath through mouth. O2 sat 97-98%, EDP aware.

## 2020-08-23 ENCOUNTER — Other Ambulatory Visit: Payer: Self-pay | Admitting: Internal Medicine

## 2020-08-31 ENCOUNTER — Other Ambulatory Visit: Payer: Self-pay

## 2020-08-31 ENCOUNTER — Emergency Department (HOSPITAL_COMMUNITY)
Admission: EM | Admit: 2020-08-31 | Discharge: 2020-08-31 | Disposition: A | Discharge status: Home / Self Care | Payer: Medicare HMO | Attending: Emergency Medicine | Admitting: Emergency Medicine

## 2020-08-31 ENCOUNTER — Encounter (HOSPITAL_COMMUNITY): Payer: Self-pay | Admitting: *Deleted

## 2020-08-31 ENCOUNTER — Emergency Department (HOSPITAL_COMMUNITY): Payer: Medicare HMO

## 2020-08-31 DIAGNOSIS — Z20822 Contact with and (suspected) exposure to covid-19: Secondary | ICD-10-CM | POA: Insufficient documentation

## 2020-08-31 DIAGNOSIS — J441 Chronic obstructive pulmonary disease with (acute) exacerbation: Secondary | ICD-10-CM | POA: Diagnosis not present

## 2020-08-31 DIAGNOSIS — Z79899 Other long term (current) drug therapy: Secondary | ICD-10-CM | POA: Insufficient documentation

## 2020-08-31 DIAGNOSIS — Z7982 Long term (current) use of aspirin: Secondary | ICD-10-CM | POA: Diagnosis not present

## 2020-08-31 DIAGNOSIS — I110 Hypertensive heart disease with heart failure: Secondary | ICD-10-CM | POA: Insufficient documentation

## 2020-08-31 DIAGNOSIS — I509 Heart failure, unspecified: Secondary | ICD-10-CM | POA: Insufficient documentation

## 2020-08-31 DIAGNOSIS — R600 Localized edema: Secondary | ICD-10-CM | POA: Insufficient documentation

## 2020-08-31 DIAGNOSIS — Z950 Presence of cardiac pacemaker: Secondary | ICD-10-CM | POA: Diagnosis not present

## 2020-08-31 DIAGNOSIS — R0600 Dyspnea, unspecified: Secondary | ICD-10-CM | POA: Diagnosis present

## 2020-08-31 DIAGNOSIS — F17210 Nicotine dependence, cigarettes, uncomplicated: Secondary | ICD-10-CM | POA: Diagnosis not present

## 2020-08-31 LAB — CBC WITH DIFFERENTIAL/PLATELET
Abs Immature Granulocytes: 0.17 10*3/uL — ABNORMAL HIGH (ref 0.00–0.07)
Basophils Absolute: 0 10*3/uL (ref 0.0–0.1)
Basophils Relative: 0 %
Eosinophils Absolute: 0.1 10*3/uL (ref 0.0–0.5)
Eosinophils Relative: 0 %
HCT: 43.7 % (ref 39.0–52.0)
Hemoglobin: 15.5 g/dL (ref 13.0–17.0)
Immature Granulocytes: 1 %
Lymphocytes Relative: 5 %
Lymphs Abs: 0.9 10*3/uL (ref 0.7–4.0)
MCH: 35.6 pg — ABNORMAL HIGH (ref 26.0–34.0)
MCHC: 35.5 g/dL (ref 30.0–36.0)
MCV: 100.5 fL — ABNORMAL HIGH (ref 80.0–100.0)
Monocytes Absolute: 0.4 10*3/uL (ref 0.1–1.0)
Monocytes Relative: 2 %
Neutro Abs: 14.9 10*3/uL — ABNORMAL HIGH (ref 1.7–7.7)
Neutrophils Relative %: 92 %
Platelets: 278 10*3/uL (ref 150–400)
RBC: 4.35 MIL/uL (ref 4.22–5.81)
RDW: 11.9 % (ref 11.5–15.5)
WBC: 16.4 10*3/uL — ABNORMAL HIGH (ref 4.0–10.5)
nRBC: 0 % (ref 0.0–0.2)

## 2020-08-31 LAB — COMPREHENSIVE METABOLIC PANEL
ALT: 51 U/L — ABNORMAL HIGH (ref 0–44)
AST: 36 U/L (ref 15–41)
Albumin: 3.7 g/dL (ref 3.5–5.0)
Alkaline Phosphatase: 58 U/L (ref 38–126)
Anion gap: 10 (ref 5–15)
BUN: 14 mg/dL (ref 8–23)
CO2: 29 mmol/L (ref 22–32)
Calcium: 9.1 mg/dL (ref 8.9–10.3)
Chloride: 93 mmol/L — ABNORMAL LOW (ref 98–111)
Creatinine, Ser: 0.7 mg/dL (ref 0.61–1.24)
GFR, Estimated: >60 mL/min (ref >60)
Glucose, Bld: 118 mg/dL — ABNORMAL HIGH (ref 70–99)
Potassium: 5 mmol/L (ref 3.5–5.1)
Sodium: 132 mmol/L — ABNORMAL LOW (ref 135–145)
Total Bilirubin: 1.1 mg/dL (ref 0.3–1.2)
Total Protein: 7 g/dL (ref 6.5–8.1)

## 2020-08-31 LAB — RESP PANEL BY RT-PCR (FLU A&B, COVID) ARPGX2
Influenza A by PCR: NEGATIVE
Influenza B by PCR: NEGATIVE
SARS Coronavirus 2 by RT PCR: NEGATIVE

## 2020-08-31 LAB — TROPONIN I (HIGH SENSITIVITY)
Troponin I (High Sensitivity): 11 ng/L (ref <18)
Troponin I (High Sensitivity): 12 ng/L (ref <18)

## 2020-08-31 MED ORDER — MAGNESIUM SULFATE IN D5W 1-5 GM/100ML-% IV SOLN
1.0000 g | Freq: Once | INTRAVENOUS | Status: AC
  Administered 2020-08-31: 1 g via INTRAVENOUS
  Filled 2020-08-31: qty 100

## 2020-08-31 MED ORDER — PREDNISONE 50 MG PO TABS
60.0000 mg | ORAL_TABLET | Freq: Once | ORAL | Status: AC
  Administered 2020-08-31: 60 mg via ORAL
  Filled 2020-08-31: qty 1

## 2020-08-31 MED ORDER — IPRATROPIUM-ALBUTEROL 0.5-2.5 (3) MG/3ML IN SOLN
3.0000 mL | Freq: Once | RESPIRATORY_TRACT | Status: AC
  Administered 2020-08-31: 3 mL via RESPIRATORY_TRACT
  Filled 2020-08-31: qty 3

## 2020-08-31 MED ORDER — LEVOFLOXACIN 500 MG PO TABS: 500.0000 mg | ORAL_TABLET | Freq: Every day | ORAL | 0 refills | Status: AC

## 2020-08-31 MED ORDER — METHYLPREDNISOLONE SODIUM SUCC 125 MG IJ SOLR
125.0000 mg | Freq: Once | INTRAMUSCULAR | Status: AC
  Administered 2020-08-31: 125 mg via INTRAVENOUS
  Filled 2020-08-31: qty 2

## 2020-08-31 MED ORDER — ALBUTEROL (5 MG/ML) CONTINUOUS INHALATION SOLN
10.0000 mg/h | INHALATION_SOLUTION | Freq: Once | RESPIRATORY_TRACT | Status: AC
  Administered 2020-08-31: 10 mg/h via RESPIRATORY_TRACT
  Filled 2020-08-31: qty 20

## 2020-08-31 MED ORDER — PREDNISONE 10 MG PO TABS: 40.0000 mg | ORAL_TABLET | Freq: Every day | ORAL | 0 refills | Status: AC

## 2020-08-31 NOTE — ED Provider Notes (Addendum)
[facility]   CSN: [id number] Arrival date & time: 08/31/20  1301     History Chief Complaint  Patient presents with   Shortness of Breath    Eric Diaz is a 75 y.o. 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  Patient states he is had problems with trouble breathing his whole life, worse over the past 1 month, he has been on multiple rounds of steroids and antibiotics with some improvement but not resolution.  Most recently completed doxycycline and a prednisone burst 08/27/2020 after an ER visit 08/22/20.  He also had doxycycline and steroids at the beginning of June.  Started to get worse again after completing treatment.  He states he is coughing up thick yellow sputum and having associated dyspnea, and wheezing.  Trying to use his inhaler/breathing treatments at home without much relief.  He denies fever, vomiting, chest pain, abdominal pain, hemoptysis, leg pain/swelling, or syncope.  HPI     Past Medical History:  Diagnosis Date   COPD (chronic obstructive pulmonary disease) (HCC)    High cholesterol    Hypertension     Patient Active Problem List   Diagnosis Date Noted   Pacemaker 04/01/2019   Lobar pneumonia (HCC) 02/17/2019   Sepsis due to undetermined organism (HCC) 02/16/2019   Sepsis (HCC) 02/15/2019   Acute exacerbation of chronic obstructive pulmonary disease (COPD) (HCC) 02/15/2019   CAP (community acquired pneumonia) 02/15/2019   AKI (acute kidney injury) (HCC) 02/15/2019   Acute respiratory failure with hypoxia (HCC) 02/15/2019   Elevated troponin 02/15/2019   Essential hypertension    Complete heart block (HCC)    SOB (shortness of breath)    Acute heart failure (HCC)    Heart block 03/05/2018   AV block, 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 No pertinent family history.  Social History   Tobacco Use   Smoking status: Every Day    Packs/day: 1.00    Pack years: 0.00    Types: Cigarettes   Smokeless tobacco: Never  Vaping Use   Vaping Use: Never used  Substance Use Topics   Alcohol use: Yes    Comment: occasionally   Drug use: No    Home Medications Prior to Admission medications   Medication Sig Start Date End Date Taking? Authorizing Provider  acetaminophen (TYLENOL) 500 MG tablet Take 1,000 mg by mouth 2 (two) times daily.   Yes [provider]  albuterol (VENTOLIN HFA) 108 (90 Base) MCG/ACT inhaler Inhale 1 puff into the lungs every 6 (six) hours as needed for wheezing or shortness of breath. Plz see PCP for additional refills. 11/22/18  Yes Duke SalviaKlein, Steven C, MD  Albuterol Sulfate 2.5 MG/0.5ML NEBU Inhale 2.5 mg into the lungs 2 (two) times daily as needed (shortness of breath).   Yes [provider]  aspirin EC  81 MG tablet Take 81 mg by mouth daily.   Yes [provider]  atorvastatin (LIPITOR) 40 MG tablet Take 40 mg by mouth daily. 01/16/20  Yes [provider]  carvedilol (COREG) 12.5 MG tablet TAKE 1.5 TABLETS (18.75 MG TOTAL) BY MOUTH 2 (TWO) TIMES DAILY. 08/24/20  Yes Marinus Maw, MD  Cyanocobalamin (B-12 PO) Take 1 tablet by mouth daily.   Yes [provider]  Multiple Vitamin (MULTIVITAMIN WITH MINERALS) TABS tablet Take 1 tablet by mouth daily.   Yes [provider]  predniSONE  (DELTASONE) 20 MG tablet Take 2 tablets (40 mg total) by mouth daily. Patient taking differently: Take 10 mg by mouth daily. 08/22/20  Yes Benjiman Core, MD  TRELEGY ELLIPTA 100-62.5-25 MCG/INH AEPB Inhale 1 puff into the lungs daily. 04/06/20  Yes [provider]  doxycycline (VIBRAMYCIN) 100 MG capsule Take 1 capsule (100 mg total) by mouth 2 (two) times daily. Patient not taking: No sig reported 08/22/20   Benjiman Core, MD  feeding supplement, ENSURE ENLIVE, (ENSURE ENLIVE) LIQD Take 237 mLs by mouth 2 (two) times daily between meals. 02/18/19   Catarina Hartshorn, MD  guaiFENesin-dextromethorphan (ROBITUSSIN DM) 100-10 MG/5ML syrup Take 5 mLs by mouth 3 (three) times daily as needed for cough. Patient not taking: Reported on 08/31/2020 08/22/20   Benjiman Core, MD    Allergies    Bee venom  Review of Systems   Review of Systems  Constitutional:  Negative for chills and fever.  Respiratory:  Positive for cough, shortness of breath and wheezing.   Cardiovascular:  Negative for chest pain and leg swelling.  Gastrointestinal:  Negative for abdominal pain and vomiting.  Neurological:  Negative for dizziness and syncope.  All other systems reviewed and are negative.  Physical Exam Updated Vital Signs BP (!) 156/70   Pulse 76   Temp 97.7 F (36.5 C) (Oral)   Resp 17   SpO2 99%   Physical Exam Vitals and nursing note reviewed.  Constitutional:      Appearance: He is well-developed. He is not toxic-appearing.  HENT:     Head: Normocephalic and atraumatic.     Right Ear: Tympanic membrane is not perforated, erythematous, retracted or bulging.     Left Ear: Tympanic membrane is not perforated, erythematous, retracted or bulging.     Nose: Nose normal.     Mouth/Throat:     Pharynx: Uvula midline. No oropharyngeal exudate or posterior oropharyngeal erythema.  Eyes:     General:        Right eye: No discharge.        Left eye: No discharge.     Conjunctiva/sclera:  Conjunctivae normal.  Cardiovascular:     Rate and Rhythm: Normal rate and regular rhythm.  Pulmonary:     Effort: Tachypnea present. No respiratory distress.     Breath sounds: Wheezing (Biphasic, primarily expiratory throughout all lung fields.) present.     Comments: Poor air movement throughout. Abdominal:     General: There is no distension.     Palpations: Abdomen is soft.     Tenderness: There is no abdominal tenderness.  Musculoskeletal:     Cervical back: Normal range of motion and neck supple.     Comments: Trace bilateral lower extremity edema to the lower legs.  No calf tenderness.    Lymphadenopathy:     Cervical: No cervical adenopathy.  Skin:    General: Skin is warm and dry.     Findings: No  rash.  Neurological:     Mental Status: He is alert.     Comments: Clear speech.   Psychiatric:        Behavior: Behavior normal.        Thought Content: Thought content normal.    ED Results / Procedures / Treatments   Labs (all labs ordered are listed, but only abnormal results are displayed) Labs Reviewed  COMPREHENSIVE METABOLIC PANEL - Abnormal; Notable for the following components:      Result Value   Sodium 132 (*)    Chloride 93 (*)    Glucose, Bld 118 (*)    ALT 51 (*)    All other components within normal limits  CBC WITH DIFFERENTIAL/PLATELET - Abnormal; Notable for the following components:   WBC 16.4 (*)    MCV 100.5 (*)    MCH 35.6 (*)    Neutro Abs 14.9 (*)    Abs Immature Granulocytes 0.17 (*)    All other components within normal limits  RESP PANEL BY RT-PCR (FLU A&B, COVID) ARPGX2  TROPONIN I (HIGH SENSITIVITY)  TROPONIN I (HIGH SENSITIVITY)    EKG EKG Interpretation  Date/Time:  Tuesday August 31 2020 13:19:17 EDT Ventricular Rate:  83 PR Interval:  170 QRS Duration: 142 QT Interval:  424 QTC Calculation: 499 R Axis:   91 Text Interpretation: Sinus rhythm RBBB and LPFB No acute changes No significant change since last tracing Confirmed  by Derwood Kaplan ([id number]) on 08/31/2020 1:55:36 PM  Radiology DG Chest Portable 1 View  Result Date: 08/31/2020 CLINICAL DATA:  Dyspnea EXAM: PORTABLE CHEST 1 VIEW COMPARISON:  Radiograph 08/22/2020 FINDINGS: Unchanged dual chamber pacemaker leads. Unchanged cardiomediastinal silhouette. No new focal airspace consolidation. Unchanged moderate central prominence. No large pleural effusion or visible pneumothorax. There is no acute osseous abnormality. IMPRESSION: No new focal airspace disease. Electronically Signed   By: Caprice Renshaw   On: 08/31/2020 13:59    Procedures .Critical Care  Date/Time: 08/31/2020 7:00 PM Performed by: Cherly Anderson, PA-C Authorized by: Cherly Anderson, PA-C    CRITICAL CARE Performed by: Harvie Heck   Total critical care time: 45 minutes  Critical care time was exclusive of separately billable procedures and treating other patients.  Critical care was necessary to treat or prevent imminent or life-threatening deterioration.  Critical care was time spent personally by me on the following activities: development of treatment plan with patient and/or surrogate as well as nursing, discussions with consultants, evaluation of patient's response to treatment, examination of patient, obtaining history from patient or surrogate, ordering and performing treatments and interventions, ordering and review of laboratory studies, ordering and review of radiographic studies, pulse oximetry and re-evaluation of patient's condition.  Medications Ordered in ED Medications  ipratropium-albuterol (DUONEB) 0.5-2.5 (3) MG/3ML nebulizer solution 3 mL (3 mLs Nebulization Given 08/31/20 1404)  methylPREDNISolone sodium succinate (SOLU-MEDROL) 125 mg/2 mL injection 125 mg (125 mg Intravenous Given 08/31/20 1404)  ipratropium-albuterol (DUONEB) 0.5-2.5 (3) MG/3ML nebulizer solution 3 mL (3 mLs Nebulization Given 08/31/20 1515)  predniSONE (DELTASONE) tablet 60 mg (60 mg  Oral Given 08/31/20 1509)  ipratropium-albuterol (DUONEB) 0.5-2.5 (3) MG/3ML nebulizer solution 3 mL (3 mLs Nebulization Given 08/31/20 1604)    ED Course  I have reviewed the triage vital signs and the nursing notes.  Pertinent labs & imaging results that were available during my care of the patient were reviewed by me and considered in my medical decision making (see chart for details).    MDM Rules/Calculators/A&P  Patient presents to the ED with complaints of shortness of breath.  Patient is nontoxic, notably hypertensive, he has poor air movement with biphasic wheezing mostly expiratory throughout.  Primary concern for COPD exacerbation, additional ddx- pneumonia, covid, CHF, atypical ACS, PE. Will give IV steroids and administer DuoNeb. Labs, EKG, and CXR ordered for further assessment.   Additional history obtained:  Additional history obtained from chart review & nursing note review. Patient on tripled therapy inhaler for maintenance.   Lab Tests:  I Ordered, reviewed, and interpreted labs, which included:  CBC: Leukocytosis 16.4, patient has been on steroids frequently and is also on these chronically. DPO:EUMP hyponatremia/chloremia, no significant electrolyte derangement.  Renal function is preserved Troponin: No significant elevation, flat COVID/influenza testing: Negative  Imaging Studies ordered:  I ordered imaging studies which included CXR, I independently reviewed, formal radiology impression shows: No new focal airspace disease.  ED Course:  14:41: RE-EVAL: Following initial duoneb remains wheezing,additional duoneb ordered.   15:30: RE-EVAL: Some improvement in air movement, remains with expiratory wheezing, 3rd duoneb ordered.   17:00: RE-EVAL: Patient continues to improve, remains with mild wheezing, continuous neb ordered.   19:00: Patient care signed out to [facility] PA-C at change of shift pending reassessment following continuous neb  and disposition. If patient able to ambulate without hypoxia/respiratory distress can likely be discharged home with levaquin (given 3rd COPD exacerbation within past 2 months with chronic steroid use) and steroid tx with close pulmonology follow up. Referral placed to pulmonology office in [place] as patient would like to see someone closer to this location as opposed to going to [place] as he has previously for his pulmonology care.   Findings and plan of care discussed with supervising physician Dr. Rhunette Croft who has evaluated the patient & Dr. Hyacinth Meeker @ change of shift who are in agreement.   Portions of this note were generated with Scientist, clinical (histocompatibility and immunogenetics). Dictation errors may occur despite best attempts at proofreading.  Final Clinical Impression(s) / ED Diagnoses Final diagnoses:  COPD exacerbation [facility])    Rx / DC Orders ED Discharge Orders     None        Cherly Anderson, PA-C 08/31/20 1901    Desmond Lope 08/31/20 1904    Eber Hong, MD 09/02/20 1249

## 2020-08-31 NOTE — ED Triage Notes (Signed)
Shortness of breath

## 2020-08-31 NOTE — Discharge Instructions (Addendum)
Please take the antibiotics and steroids as prescribed for the entire course.  Please follow-up with your pulmonologist.  You may always return to the ER for any new or concerning symptoms.  Please stop smoking.

## 2020-08-31 NOTE — ED Notes (Signed)
Pt ambulated once around nurses' station. O2 sats were 92% at rest prior to ambulation and dropped to 91% post ambulation. Pt states breathing "felt fine" but pt noted to have dyspnea with exertion.

## 2020-08-31 NOTE — ED Provider Notes (Signed)
Accepted handoff at shift change from [facility]. Please see prior provider note for more detail.   Briefly: Patient is 75 y.o.   Here with COPD exacerbation.  He states he is completely improved at this time.     Physical Exam  BP 124/84   Pulse 91   Temp 97.7 F (36.5 C) (Oral)   Resp (!) 21   SpO2 100%   Physical Exam Vitals and nursing note reviewed.  Constitutional:      General: He is not in acute distress.    Appearance: Normal appearance. He is not ill-appearing.  HENT:     Head: Normocephalic and atraumatic.  Eyes:     General: No scleral icterus.       Right eye: No discharge.        Left eye: No discharge.     Conjunctiva/sclera: Conjunctivae normal.  Pulmonary:     Effort: Pulmonary effort is normal.     Breath sounds: No stridor.     Comments: Coarse breath sounds but with no wheezing. Neurological:     Mental Status: He is alert and oriented to person, place, and time. Mental status is at baseline.    ED Course/Procedures     Procedures Results for orders placed or performed during the hospital encounter of 08/31/20  Resp Panel by RT-PCR (Flu A&B, Covid) Nasopharyngeal Swab   Specimen: Nasopharyngeal Swab; Nasopharyngeal(NP) swabs in vial transport medium  Result Value Ref Range   SARS Coronavirus 2 by RT PCR NEGATIVE NEGATIVE   Influenza A by PCR NEGATIVE NEGATIVE   Influenza B by PCR NEGATIVE NEGATIVE  Comprehensive metabolic panel  Result Value Ref Range   Sodium 132 (L) 135 - 145 mmol/L   Potassium 5.0 3.5 - 5.1 mmol/L   Chloride 93 (L) 98 - 111 mmol/L   CO2 29 22 - 32 mmol/L   Glucose, Bld 118 (H) 70 - 99 mg/dL   BUN 14 8 - 23 mg/dL   Creatinine, Ser 4.01 0.61 - 1.24 mg/dL   Calcium 9.1 8.9 - 02.7 mg/dL   Total Protein 7.0 6.5 - 8.1 g/dL   Albumin 3.7 3.5 - 5.0 g/dL   AST 36 15 - 41 U/L   ALT 51 (H) 0 - 44 U/L   Alkaline Phosphatase 58 38 - 126 U/L   Total Bilirubin 1.1 0.3 - 1.2 mg/dL   GFR, Estimated >25 >36 mL/min   Anion gap  10 5 - 15  CBC with Differential  Result Value Ref Range   WBC 16.4 (H) 4.0 - 10.5 K/uL   RBC 4.35 4.22 - 5.81 MIL/uL   Hemoglobin 15.5 13.0 - 17.0 g/dL   HCT 64.4 03.4 - 74.2 %   MCV 100.5 (H) 80.0 - 100.0 fL   MCH 35.6 (H) 26.0 - 34.0 pg   MCHC 35.5 30.0 - 36.0 g/dL   RDW 59.5 63.8 - 75.6 %   Platelets 278 150 - 400 K/uL   nRBC 0.0 0.0 - 0.2 %   Neutrophils Relative % 92 %   Neutro Abs 14.9 (H) 1.7 - 7.7 K/uL   Lymphocytes Relative 5 %   Lymphs Abs 0.9 0.7 - 4.0 K/uL   Monocytes Relative 2 %   Monocytes Absolute 0.4 0.1 - 1.0 K/uL   Eosinophils Relative 0 %   Eosinophils Absolute 0.1 0.0 - 0.5 K/uL   Basophils Relative 0 %   Basophils Absolute 0.0 0.0 - 0.1 K/uL   Immature Granulocytes 1 %  Abs Immature Granulocytes 0.17 (H) 0.00 - 0.07 K/uL  Troponin I (High Sensitivity)  Result Value Ref Range   Troponin I (High Sensitivity) 11 <18 ng/L  Troponin I (High Sensitivity)  Result Value Ref Range   Troponin I (High Sensitivity) 12 <18 ng/L   DG Chest Portable 1 View  Result Date: 08/31/2020 CLINICAL DATA:  Dyspnea EXAM: PORTABLE CHEST 1 VIEW COMPARISON:  Radiograph 08/22/2020 FINDINGS: Unchanged dual chamber pacemaker leads. Unchanged cardiomediastinal silhouette. No new focal airspace consolidation. Unchanged moderate central prominence. No large pleural effusion or visible pneumothorax. There is no acute osseous abnormality. IMPRESSION: No new focal airspace disease. Electronically Signed   By: Caprice Renshaw   On: 08/31/2020 13:59   DG Chest Portable 1 View  Result Date: 08/22/2020 CLINICAL DATA:  Shortness of breath. EXAM: PORTABLE CHEST 1 VIEW COMPARISON:  February 15, 2019 FINDINGS: Stable pacemaker. No pneumothorax. The cardiomediastinal silhouette is stable. No acute infiltrate, nodule, or mass. Mild interstitial prominence is stable, likely bronchitic change. No other acute abnormalities. IMPRESSION: No acute abnormalities to explain the patient's shortness of breath. No  interval changes. Electronically Signed   By: Gerome Sam III M.D   On: 08/22/2020 16:54    MDM  Patient is 75 year old male presented today with shortness of breath found to be in COPD exacerbation.  I reassessed patient after his medications were given.  I did give him 1 g of magnesium as well.  My examination he is not wheezing at all.  He feels much improved he states will discharge home with Levaquin and prednisone per past providers recommendations.  Tobacco counseling was had.  Urged for cessation of smoking.    Solon Augusta [place], [place] 08/31/20 2344    Eber Hong, MD 09/02/20 1249

## 2020-09-10 ENCOUNTER — Other Ambulatory Visit: Payer: Self-pay

## 2020-09-10 ENCOUNTER — Ambulatory Visit: Payer: Medicare HMO | Admitting: Internal Medicine

## 2020-09-10 ENCOUNTER — Other Ambulatory Visit: Payer: Self-pay | Admitting: Internal Medicine

## 2020-09-10 ENCOUNTER — Encounter: Payer: Self-pay | Admitting: Internal Medicine

## 2020-09-10 DIAGNOSIS — I10 Essential (primary) hypertension: Secondary | ICD-10-CM

## 2020-09-10 DIAGNOSIS — J449 Chronic obstructive pulmonary disease, unspecified: Secondary | ICD-10-CM | POA: Diagnosis not present

## 2020-09-10 MED ORDER — PREDNISONE 10 MG PO TABS: ORAL_TABLET | ORAL | 0 refills | Status: DC

## 2020-09-10 MED ORDER — PANTOPRAZOLE SODIUM 40 MG PO TBEC
40.0000 mg | DELAYED_RELEASE_TABLET | Freq: Every day | ORAL | 2 refills | Status: DC
Start: 2020-09-10 — End: 2020-10-22

## 2020-09-10 MED ORDER — FAMOTIDINE 20 MG PO TABS: ORAL_TABLET | ORAL | 11 refills | Status: DC

## 2020-09-10 MED ORDER — BISOPROLOL FUMARATE 5 MG PO TABS: 5.0000 mg | ORAL_TABLET | Freq: Every day | ORAL | 11 refills | Status: DC

## 2020-09-10 NOTE — Assessment & Plan Note (Signed)
Changed coreg to bisoprolol 5 mg daily 09/10/2020 >>>   In the setting of respiratory symptoms of unknown etiology,  It would be preferable to use bystolic, the most beta -1  selective Beta blocker available in sample form, with bisoprolol the most selective generic choice  on the market, at least on a trial basis, to make sure the spillover Beta 2 effects of the less specific Beta blockers are not contributing to this patient's symptoms.           Each maintenance medication was reviewed in detail including emphasizing most importantly the difference between maintenance and prns and under what circumstances the prns are to be triggered using an action plan format where appropriate.  Total time for H and P, chart review, counseling, reviewing dpi/hfa  device(s) and generating customized AVS unique to this office visit / same day charting = 50 min

## 2020-09-10 NOTE — Assessment & Plan Note (Addendum)
Quit smoking 07/2020 steroid dep x 2019  - Labs ordered 09/10/2020  :  allergy profile   alpha one AT phenotype    DDX of  difficult airways management almost all start with A and  include Adherence, Ace Inhibitors, Acid Reflux, Active Sinus Disease, Alpha 1 Antitripsin deficiency, Anxiety masquerading as Airways dz,  ABPA,  Allergy(esp in young), Aspiration (esp in elderly), Adverse effects of meds,  Active smoking or vaping, A bunch of PE's (a small clot burden can't cause this syndrome unless there is already severe underlying pulm or vascular dz with poor reserve) plus two Bs  = Bronchiectasis and Beta blocker use..and one C= CHF Adherence is always the initial "prime suspect" and is a multilayered concern that requires a "trust but verify" approach in every patient - starting with knowing how to use medications, especially inhalers, correctly, keeping up with refills and understanding the fundamental difference between maintenance and prns vs those medications only taken for a very short course and then stopped and not refilled.  - - The proper method of use, as well as anticipated side effects, of a metered-dose inhaler were discussed and demonstrated to the patient using teach back method.  - return with all meds in hand using a trust but verify approach to confirm accurate Medication  Reconciliation The principal here is that until we are certain that the  patients are doing what we've asked, it makes no sense to ask them to do more.  ? Alpha one > send phenoypt  ? Allergy/abpa > check IgE / eos  ? BB effects > see hbp  Clearly  Group D in terms of symptom/risk and laba/lama/ICS  therefore appropriate rx at this point >>>  Continue trelegy and daily pred with ceiling 20 and floor 10 mg daily and approp saba  I spent extra time with pt today reviewing appropriate use of albuterol for prn use on exertion with the following points: 1) saba is for relief of sob that does not improve by walking a  slower pace or resting but rather if the pt does not improve after trying this first. 2) If the pt is convinced, as many are, that saba helps recover from activity faster then it's easy to tell if this is the case by re-challenging : ie stop, take the inhaler, then p 5 minutes try the exact same activity (intensity of workload) that just caused the symptoms and see if they are substantially diminished or not after saba 3) if there is an activity that reproducibly causes the symptoms, try the saba 15 min before the activity on alternate days   If in fact the saba really does help, then fine to continue to use it prn but advised may need to look closer at the maintenance regimen being used to achieve better control of airways disease with exertion.   Also added gerd rx empirically while attempting to wean prednisone

## 2020-09-10 NOTE — Patient Instructions (Signed)
I recommend you get off the coreg  and the substitute is called bisoprolol 5 mg one daily   Plan A = Automatic = Always=    Trelegy 100 one click  first thing each am   Prednisone 10 mg daily - take 2 daily if not doing great   Pantoprazole (protonix) 40 mg   Take  30-60 min before first meal of the day and Pepcid (famotidine)  20 mg after supper until return to office - this is the best way to tell whether stomach acid is contributing to your problem.    Plan B = Backup (to supplement plan A, not to replace it) Only use your albuterol inhaler as a rescue medication to be used if you can't catch your breath by resting or doing a relaxed purse lip breathing pattern.  - The less you use it, the better it will work when you need it. - Ok to use the inhaler up to 2 puffs  every 4 hours if you must but call for appointment if use goes up over your usual need - Don't leave home without it !!  (think of it like the spare tire for your car)   Plan C = Crisis (instead of Plan B but only if Plan B stops working) - only use your albuterol nebulizer if you first try Plan B and it fails to help > ok to use the nebulizer up to every 4 hours but if start needing it regularly call for immediate appointment    GERD (REFLUX)  is an extremely common cause of respiratory symptoms just like yours , many times with no obvious heartburn at all.    It can be treated with medication, but also with lifestyle changes including elevation of the head of your bed (ideally with 6 -8inch blocks under the headboard of your bed),  Smoking cessation, avoidance of late meals, excessive alcohol, and avoid fatty foods, chocolate, peppermint, colas, red wine, and acidic juices such as orange juice.  NO MINT OR MENTHOL PRODUCTS SO NO COUGH DROPS  USE SUGARLESS CANDY INSTEAD (Jolley ranchers or Stover's or Life Savers) or even ice chips will also do - the key is to swallow to prevent all throat clearing. NO OIL BASED VITAMINS - use  powdered substitutes.  Avoid fish oil when coughing.    Please schedule a follow up office visit in 4 weeks, sooner if needed  with all medications /inhalers/ solutions in hand so we can verify exactly what you are taking. This includes all medications from all doctors and over the counters

## 2020-09-10 NOTE — Progress Notes (Signed)
Eric Diaz, male    DOB: 06-May-1945,     MRN: [id number]   Brief patient profile:  45 yowm quit smoking 07/2020 onset around 2018 on prn saba then trelegy started around 2019 and steroid dep at 10 mg /day with 3 flares since May 2022 while in [place] referred to pulmonary clinic in [place]  09/10/2020 by EDP      History of Present Illness  09/10/2020  Pulmonary/ 1st office eval/ [name] / [place] [facility]  Chief Complaint  Patient presents with   Pulmonary Consult    Referred by [facility]- pt with COPD and frequent flare ups since June 2022.  He states wheezing some today.   Dyspnea:  worse just today finished pred 4 days prior to OV   Cough: better slt yellowish  Sleep: better p last ER / flat bed with pillow SABA use: neb 4 h prior to OV  02 none   No obvious patterns in day to day or daytime variability or assoc   mucus plugs or hemoptysis or cp or chest tightness,  or overt  hb symptoms.   Also denies any obvious fluctuation of symptoms with weather or environmental changes or other aggravating or alleviating factors except as outlined above   No unusual exposure hx or h/o childhood pna/ asthma or knowledge of premature birth.  Current Allergies, Complete Past Medical History, Past Surgical History, Family History, and Social History were reviewed in [name].  ROS  The following are not active complaints unless bolded Hoarseness, sore throat, dysphagia, dental problems, itching, sneezing,  nasal congestion or discharge of excess mucus or purulent secretions, ear ache,   fever, chills, sweats, unintended wt loss or wt gain, classically pleuritic or exertional cp,  orthopnea pnd or arm/hand swelling  or leg swelling, presyncope, palpitations, abdominal pain, anorexia, nausea, vomiting, diarrhea  or change in bowel habits or change in bladder habits, change in stools or change in urine, dysuria, hematuria,  rash, arthralgias, visual complaints,  headache, numbness, weakness or ataxia or problems with walking or coordination,  change in mood or  memory.           Past Medical History:  Diagnosis Date   COPD (chronic obstructive pulmonary disease) (HCC)    High cholesterol    Hypertension     Outpatient Medications Prior to Visit  Medication Sig Dispense Refill   acetaminophen (TYLENOL) 500 MG tablet Take 1,000 mg by mouth 2 (two) times daily.     albuterol (VENTOLIN HFA) 108 (90 Base) MCG/ACT inhaler Inhale 1 puff into the lungs every 6 (six) hours as needed for wheezing or shortness of breath. Plz see PCP for additional refills. 6.7 g 0   Albuterol Sulfate 2.5 MG/0.5ML NEBU Inhale 2.5 mg into the lungs 2 (two) times daily as needed (shortness of breath).     aspirin EC 81 MG tablet Take 81 mg by mouth daily.     atorvastatin (LIPITOR) 40 MG tablet Take 40 mg by mouth daily.     carvedilol (COREG) 12.5 MG tablet TAKE 1.5 TABLETS (18.75 MG TOTAL) BY MOUTH 2 (TWO) TIMES DAILY. 270 tablet 2   Cyanocobalamin (B-12 PO) Take 1 tablet by mouth daily.     feeding supplement, ENSURE ENLIVE, (ENSURE ENLIVE) LIQD Take 237 mLs by mouth 2 (two) times daily between meals. 237 mL 12   Multiple Vitamin (MULTIVITAMIN WITH MINERALS) TABS tablet Take 1 tablet by mouth daily.     predniSONE (DELTASONE) 10 MG  tablet Take 10 mg by mouth daily with breakfast.     Probiotic Product (PROBIOTIC PO) Take 1 tablet by mouth daily.     TRELEGY ELLIPTA 100-62.5-25 MCG/INH AEPB Inhale 1 puff into the lungs daily.     zinc gluconate 50 MG tablet Take 50 mg by mouth daily.     doxycycline (VIBRAMYCIN) 100 MG capsule Take 1 capsule (100 mg total) by mouth 2 (two) times daily. (Patient not taking: No sig reported) 10 capsule 0   guaiFENesin-dextromethorphan (ROBITUSSIN DM) 100-10 MG/5ML syrup Take 5 mLs by mouth 3 (three) times daily as needed for cough. 118 mL 0   No facility-administered medications prior to visit.     Objective:     BP 130/68 (BP  Location: Left Arm, Cuff Size: Normal)   Pulse 84   Temp 98 F (36.7 C) (Oral)   Ht 6' (1.829 m)   Wt 187 lb (84.8 kg)   SpO2 98% Comment: on RA  BMI 25.36 kg/m   SpO2: 98 % (on RA)  HEENT : pt wearing mask not removed for exam due to covid -19 concerns.    NECK :  without JVD/Nodes/TM/ nl carotid upstrokes bilaterally   LUNGS: no acc muscle use,  Mod barrel  contour chest wall with bilateral  Distant bs s audible wheeze and  without cough on insp or exp maneuvers and mod  Hyperresonant  to  percussion bilaterally     CV:  RRR  no s3 or murmur or increase in P2, and no edema   ABD:  mod distended  with pos mid insp Hoover's  in the supine position. No bruits or organomegaly appreciated, bowel sounds nl  MS:     ext warm without deformities, calf tenderness, cyanosis or clubbing No obvious joint restrictions   SKIN: warm and dry without lesions    NEURO:  alert, approp, nl sensorium with  no motor or cerebellar deficits apparent.        I personally reviewed images and agree with radiology impression as follows:  CXR:   portable 08/31/20 No new focal airspace disease.  Labs ordered 09/10/2020  :  allergy profile   alpha one AT phenotype        Assessment   COPD GOLD ?  Quit smoking 07/2020 steroid dep x 2019  - Labs ordered 09/10/2020  :  allergy profile   alpha one AT phenotype    DDX of  difficult airways management almost all start with A and  include Adherence, Ace Inhibitors, Acid Reflux, Active Sinus Disease, Alpha 1 Antitripsin deficiency, Anxiety masquerading as Airways dz,  ABPA,  Allergy(esp in young), Aspiration (esp in elderly), Adverse effects of meds,  Active smoking or vaping, A bunch of PE's (a small clot burden can't cause this syndrome unless there is already severe underlying pulm or vascular dz with poor reserve) plus two Bs  = Bronchiectasis and Beta blocker use..and one C= CHF Adherence is always the initial "prime suspect" and is a multilayered  concern that requires a "trust but verify" approach in every patient - starting with knowing how to use medications, especially inhalers, correctly, keeping up with refills and understanding the fundamental difference between maintenance and prns vs those medications only taken for a very short course and then stopped and not refilled.  - - The proper method of use, as well as anticipated side effects, of a metered-dose inhaler were discussed and demonstrated to the patient using teach back method.  - return  with all meds in hand using a trust but verify approach to confirm accurate Medication  Reconciliation The principal here is that until we are certain that the  patients are doing what we've asked, it makes no sense to ask them to do more.  ? Alpha one > send phenoypt  ? Allergy/abpa > check IgE / eos  ? BB effects > see hbp  Clearly  Group D in terms of symptom/risk and laba/lama/ICS  therefore appropriate rx at this point >>>  Continue trelegy and daily pred with ceiling 20 and floor 10 mg daily and approp saba  I spent extra time with pt today reviewing appropriate use of albuterol for prn use on exertion with the following points: 1) saba is for relief of sob that does not improve by walking a slower pace or resting but rather if the pt does not improve after trying this first. 2) If the pt is convinced, as many are, that saba helps recover from activity faster then it's easy to tell if this is the case by re-challenging : ie stop, take the inhaler, then p 5 minutes try the exact same activity (intensity of workload) that just caused the symptoms and see if they are substantially diminished or not after saba 3) if there is an activity that reproducibly causes the symptoms, try the saba 15 min before the activity on alternate days   If in fact the saba really does help, then fine to continue to use it prn but advised may need to look closer at the maintenance regimen being used to achieve  better control of airways disease with exertion.   Also added gerd rx empirically while attempting to wean prednisone      Essential hypertension Changed coreg to bisoprolol 5 mg daily 09/10/2020 >>>   In the setting of respiratory symptoms of unknown etiology,  It would be preferable to use bystolic, the most beta -1  selective Beta blocker available in sample form, with bisoprolol the most selective generic choice  on the market, at least on a trial basis, to make sure the spillover Beta 2 effects of the less specific Beta blockers are not contributing to this patient's symptoms.           Each maintenance medication was reviewed in detail including emphasizing most importantly the difference between maintenance and prns and under what circumstances the prns are to be triggered using an action plan format where appropriate.  Total time for H and P, chart review, counseling, reviewing dpi/hfa  device(s) and generating customized AVS unique to this office visit / same day charting = 50 min           Sandrea Hughs, MD 09/10/2020

## 2020-09-15 LAB — CBC WITH DIFFERENTIAL/PLATELET
Basophils Absolute: 0 10*3/uL (ref 0.0–0.2)
Basos: 0 %
EOS (ABSOLUTE): 0 10*3/uL (ref 0.0–0.4)
Eos: 0 %
Hematocrit: 42.7 % (ref 37.5–51.0)
Hemoglobin: 14.8 g/dL (ref 13.0–17.7)
Immature Grans (Abs): 0.2 10*3/uL — ABNORMAL HIGH (ref 0.0–0.1)
Immature Granulocytes: 2 %
Lymphocytes Absolute: 0.8 10*3/uL (ref 0.7–3.1)
Lymphs: 6 %
MCH: 34.6 pg — ABNORMAL HIGH (ref 26.6–33.0)
MCHC: 34.7 g/dL (ref 31.5–35.7)
MCV: 100 fL — ABNORMAL HIGH (ref 79–97)
Monocytes Absolute: 0.3 10*3/uL (ref 0.1–0.9)
Monocytes: 2 %
Neutrophils Absolute: 11.6 10*3/uL — ABNORMAL HIGH (ref 1.4–7.0)
Neutrophils: 90 %
Platelets: 262 10*3/uL (ref 150–450)
RBC: 4.28 x10E6/uL (ref 4.14–5.80)
RDW: 11.9 % (ref 11.6–15.4)
WBC: 12.9 10*3/uL — ABNORMAL HIGH (ref 3.4–10.8)

## 2020-09-15 LAB — ALPHA-1-ANTITRYPSIN PHENOTYP: A-1 Antitrypsin: 150 mg/dL (ref 101–187)

## 2020-09-15 LAB — IGE: IgE (Immunoglobulin E), Serum: 1348 IU/mL — ABNORMAL HIGH (ref 6–495)

## 2020-09-21 ENCOUNTER — Other Ambulatory Visit: Payer: Self-pay | Admitting: Internal Medicine

## 2020-09-21 DIAGNOSIS — R768 Other specified abnormal immunological findings in serum: Secondary | ICD-10-CM

## 2020-09-21 NOTE — Progress Notes (Signed)
Spoke with pt and notified of results per Dr. [name]. Pt verbalized understanding and denied any questions. Pt agreeable to allergy consult and I put in for this.

## 2020-09-27 ENCOUNTER — Ambulatory Visit (INDEPENDENT_AMBULATORY_CARE_PROVIDER_SITE_OTHER): Payer: Medicare HMO

## 2020-09-27 DIAGNOSIS — I442 Atrioventricular block, complete: Secondary | ICD-10-CM | POA: Diagnosis not present

## 2020-09-28 LAB — CUP PACEART REMOTE DEVICE CHECK
Battery Remaining Longevity: 92 mo
Battery Remaining Percentage: 74 %
Battery Voltage: 3.01 V
Brady Statistic AP VP Percent: 1 %
Brady Statistic AP VS Percent: 1 %
Brady Statistic AS VP Percent: 50 %
Brady Statistic AS VS Percent: 50 %
Brady Statistic RA Percent Paced: 1 %
Brady Statistic RV Percent Paced: 50 %
Date Time Interrogation Session: 20220801020013
Implantable Lead Implant Date: 20200108
Implantable Lead Implant Date: 20200108
Implantable Lead Location: 753859
Implantable Lead Location: 753860
Implantable Lead Model: 5076
Implantable Lead Model: 5076
Implantable Pulse Generator Implant Date: 20200108
Lead Channel Impedance Value: 360 Ohm
Lead Channel Impedance Value: 610 Ohm
Lead Channel Pacing Threshold Amplitude: 0.5 V
Lead Channel Pacing Threshold Amplitude: 0.75 V
Lead Channel Pacing Threshold Pulse Width: 0.4 ms
Lead Channel Pacing Threshold Pulse Width: 0.5 ms
Lead Channel Sensing Intrinsic Amplitude: 4.6 mV
Lead Channel Sensing Intrinsic Amplitude: 7.6 mV
Lead Channel Setting Pacing Amplitude: 0.75 V
Lead Channel Setting Pacing Amplitude: 2 V
Lead Channel Setting Pacing Pulse Width: 0.4 ms
Lead Channel Setting Sensing Sensitivity: 3 mV
Pulse Gen Model: 2272
Pulse Gen Serial Number: 9091999

## 2020-09-30 ENCOUNTER — Telehealth: Payer: Self-pay | Admitting: Internal Medicine

## 2020-09-30 NOTE — Telephone Encounter (Signed)
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 Please advise pt further

## 2020-10-01 NOTE — Telephone Encounter (Signed)
Spoke with pts wife, Claris Gower (DPR on file).  Assisted with sending a manual transmission.    Transmission no cardiac events occurred at time of episode.  Pt wife requested DR. Graciela Husbands be advised of event.

## 2020-10-04 ENCOUNTER — Institutional Professional Consult (permissible substitution): Payer: Medicare HMO | Admitting: Internal Medicine

## 2020-10-04 NOTE — Telephone Encounter (Signed)
Amy, noted and agree-- no tachy events  Thanks SK

## 2020-10-06 ENCOUNTER — Encounter: Payer: Self-pay | Admitting: Internal Medicine

## 2020-10-06 ENCOUNTER — Other Ambulatory Visit: Payer: Self-pay

## 2020-10-06 ENCOUNTER — Ambulatory Visit: Payer: Medicare HMO | Admitting: Internal Medicine

## 2020-10-06 DIAGNOSIS — J449 Chronic obstructive pulmonary disease, unspecified: Secondary | ICD-10-CM

## 2020-10-06 DIAGNOSIS — I10 Essential (primary) hypertension: Secondary | ICD-10-CM | POA: Diagnosis not present

## 2020-10-06 MED ORDER — TRELEGY ELLIPTA 100-62.5-25 MCG/INH IN AEPB: 1.0000 | INHALATION_SPRAY | Freq: Every day | RESPIRATORY_TRACT | 11 refills | Status: DC

## 2020-10-06 MED ORDER — ALBUTEROL SULFATE HFA 108 (90 BASE) MCG/ACT IN AERS: 1.0000 | INHALATION_SPRAY | Freq: Four times a day (QID) | RESPIRATORY_TRACT | 11 refills | Status: DC | PRN

## 2020-10-06 NOTE — Progress Notes (Signed)
Eric Diaz, male    DOB: 1945/03/04      MRN: [id number]   Brief patient profile:  20 yowm MM/quit smoking 07/2020 onset around 2018 on prn saba then trelegy started around 2019 and steroid dep at 10 mg /day with 3 flares since May 2022 while in [place] referred to pulmonary clinic in [name]  09/10/2020 by EDP      History of Present Illness  09/10/2020  Pulmonary/ 1st office eval/ Eric Diaz / [name]  Chief Complaint  Patient presents with   Pulmonary Consult    Referred by [facility]- pt with COPD and frequent flare ups since June 2022.  He states wheezing some today.   Dyspnea:  worse just today finished pred 4 days prior to OV   Cough: better slt yellowish  Sleep: better p last ER / flat bed with pillow SABA use: neb 4 h prior to OV  02 none  Rec I recommend you get off the coreg  and the substitute is called bisoprolol 5 mg one daily  Plan A = Automatic = Always=    Trelegy 100 one click  first thing each am  Prednisone 10 mg daily - take 2 daily if not doing great  Pantoprazole (protonix) 40 mg   Take  30-60 min before first meal of the day and Pepcid (famotidine)  20 mg after supper until return to office - this is the best way to tell whether stomach acid is contributing to your problem.   Plan B = Backup (to supplement plan A, not to replace it) Only use your albuterol inhaler as a rescue medication Plan C = Crisis (instead of Plan B but only if Plan B stops working) - only use your albuterol nebulizer if you first try Plan B and it fails to help > ok to use the nebulizer up to every 4 hours but if start needing it regularly call for immediate appointment GERD diet reviewed, bed blocks rec  Please schedule a follow up office visit in 4 weeks, sooner if needed  with all medications /inhalers/ solutions in hand so we can verify exactly what you are taking. This includes all medications from all doctors and over the counters    10/06/2020  f/u ov/[name]/Eric Diaz re:  atopic / MM/ maint on trelegy  / pred 20 mg one half daily  Chief Complaint  Patient presents with   Follow-up    Breathing overall doing well- not as well as it was before he had seizure approx 1 wk ago. He has used his albuterol inhaler once in the past wk and it helped.    Dyspnea:  extremely sedentary / walks with cane due to balance  Cough: minimal  Sleeping: 45 degrees recliner due to back  SABA use: as above  02: none  Covid status: x 3  Lung cancer screening: advised    No obvious day to day or daytime variability or assoc excess/ purulent sputum or mucus plugs or hemoptysis or cp or chest tightness, subjective wheeze or overt sinus or hb symptoms.   Sleeping as above without nocturnal  or early am exacerbation  of respiratory  c/o's or need for noct saba. Also denies any obvious fluctuation of symptoms with weather or environmental changes or other aggravating or alleviating factors except as outlined above   No unusual exposure hx or h/o childhood pna/ asthma or knowledge of premature birth.  Current Allergies, Complete Past Medical History, Past Surgical History, Family History, and  Social History were reviewed in Eric Diaz record.  ROS  The following are not active complaints unless bolded Hoarseness, sore throat, dysphagia, dental problems, itching, sneezing,  nasal congestion or discharge of excess mucus or purulent secretions, ear ache,   fever, chills, sweats, unintended wt loss or wt gain, classically pleuritic or exertional cp,  orthopnea pnd or arm/hand swelling  or leg swelling, presyncope, palpitations, abdominal pain, anorexia, nausea, vomiting, diarrhea  or change in bowel habits or change in bladder habits, change in stools or change in urine, dysuria, hematuria,  rash, arthralgias, visual complaints, headache, numbness, weakness or ataxia or problems with walking or coordination,  change in mood or  memory.        Current Meds  Medication  Sig   acetaminophen (TYLENOL) 500 MG tablet Take 1,000 mg by mouth 2 (two) times daily.   albuterol (VENTOLIN HFA) 108 (90 Base) MCG/ACT inhaler Inhale 1 puff into the lungs every 6 (six) hours as needed for wheezing or shortness of breath. Plz see PCP for additional refills.   Albuterol Sulfate 2.5 MG/0.5ML NEBU Inhale 2.5 mg into the lungs 2 (two) times daily as needed (shortness of breath).   aspirin EC 81 MG tablet Take 81 mg by mouth daily.   atorvastatin (LIPITOR) 40 MG tablet Take 40 mg by mouth daily.   bisoprolol (ZEBETA) 5 MG tablet Take 1 tablet (5 mg total) by mouth daily.   Cyanocobalamin (B-12 PO) Take 1 tablet by mouth daily.   famotidine (PEPCID) 20 MG tablet One after supper   feeding supplement, ENSURE ENLIVE, (ENSURE ENLIVE) LIQD Take 237 mLs by mouth 2 (two) times daily between meals.   HYDROcodone-acetaminophen (NORCO/VICODIN) 5-325 MG tablet Take 1 tablet by mouth every 6 (six) hours as needed for moderate pain.   levETIRAcetam (KEPPRA) 250 MG tablet Take 250 mg by mouth 2 (two) times daily.   Multiple Vitamin (MULTIVITAMIN WITH MINERALS) TABS tablet Take 1 tablet by mouth daily.   pantoprazole (PROTONIX) 40 MG tablet Take 1 tablet (40 mg total) by mouth daily. Take 30-60 min before first meal of the day   predniSONE (DELTASONE) 10 MG tablet Take two with breakfast until better then one daily   Probiotic Product (PROBIOTIC PO) Take 1 tablet by mouth daily.   TRELEGY ELLIPTA 100-62.5-25 MCG/INH AEPB Inhale 1 puff into the lungs daily.   zinc gluconate 50 MG tablet Take 50 mg by mouth daily.                    Past Medical History:  Diagnosis Date   COPD (chronic obstructive pulmonary disease) (HCC)    High cholesterol    Hypertension         Objective:      Wt Readings from Last 3 Encounters:  10/06/20 194 lb (88 kg)  09/10/20 187 lb (84.8 kg)  08/22/20 192 lb (87.1 kg)      Vital signs reviewed  10/06/2020  - Note at rest 02 sats  96% on RA   General  appearance:    elderly wm walks with cane    HEENT : pt wearing mask not removed for exam due to covid -19 concerns.    NECK :  without JVD/Nodes/TM/ nl carotid upstrokes bilaterally   LUNGS: no acc muscle use,  Mod barrel  contour chest wall with bilateral  Distant bs s audible wheeze and  without cough on insp or exp maneuvers and mod  Hyperresonant  to  percussion bilaterally     CV:  RRR  no s3 or murmur or increase in P2, and no edema   ABD:  soft and nontender with pos mid insp Hoover's  in the supine position. No bruits or organomegaly appreciated, bowel sounds nl  MS:     ext warm without deformities, calf tenderness, cyanosis or clubbing No obvious joint restrictions   SKIN: warm and dry with bruising over lower L abd wall, none on back in area of pain where fell one week prior to OV     NEURO:  alert, approp, nl sensorium with  no motor or cerebellar deficits apparent.               Assessment

## 2020-10-06 NOTE — Patient Instructions (Addendum)
No change in medications  Please schedule a follow up visit in 3 months but call sooner if needed  

## 2020-10-07 ENCOUNTER — Encounter: Payer: Self-pay | Admitting: Internal Medicine

## 2020-10-07 ENCOUNTER — Other Ambulatory Visit: Payer: Self-pay | Admitting: Internal Medicine

## 2020-10-07 MED ORDER — TRELEGY ELLIPTA 100-62.5-25 MCG/INH IN AEPB: 1.0000 | INHALATION_SPRAY | Freq: Every day | RESPIRATORY_TRACT | 0 refills | Status: DC

## 2020-10-07 NOTE — Assessment & Plan Note (Signed)
Quit smoking 07/2020  - Labs ordered 09/10/2020  :     alpha one AT phenotype  MM   Level 150  - Allergy profile  09/10/20  >  Eos 0.0 /  IgE  1348 > refer to allergy 09/15/2020 >>>   Group D in terms of symptom/risk and laba/lama/ICS  therefore appropriate rx at this point >>>  Continue trelegy and prn saba  Re saba: I spent extra time with pt today reviewing appropriate use of albuterol for prn use on exertion with the following points: 1) saba is for relief of sob that does not improve by walking a slower pace or resting but rather if the pt does not improve after trying this first. 2) If the pt is convinced, as many are, that saba helps recover from activity faster then it's easy to tell if this is the case by re-challenging : ie stop, take the inhaler, then p 5 minutes try the exact same activity (intensity of workload) that just caused the symptoms and see if they are substantially diminished or not after saba 3) if there is an activity that reproducibly causes the symptoms, try the saba 15 min before the activity on alternate days   If in fact the saba really does help, then fine to continue to use it prn but advised may need to look closer at the maintenance regimen being used to achieve better control of airways disease with exertion.

## 2020-10-07 NOTE — Assessment & Plan Note (Signed)
Changed coreg to bisoprolol 5 mg daily 09/10/2020 >>>   Although even in retrospect it may not be clear the coreg contributed to the pt's symptoms  adding it  back at this point or in the future would risk confusion in interpretation of non-specific respiratory symptoms to which this patient is prone  ie  Better not to muddy the waters here.   >>> Adequate control on present rx, reviewed in detail with pt > no change in rx needed           Each maintenance medication was reviewed in detail including emphasizing most importantly the difference between maintenance and prns and under what circumstances the prns are to be triggered using an action plan format where appropriate.  Total time for H and P, chart review, counseling, reviewing DPI device(s) and generating customized AVS unique to this office visit / same day charting = 25 min

## 2020-10-20 NOTE — Progress Notes (Signed)
Remote pacemaker transmission.   

## 2020-10-21 ENCOUNTER — Other Ambulatory Visit: Payer: Self-pay | Admitting: Internal Medicine

## 2020-11-07 ENCOUNTER — Emergency Department (HOSPITAL_COMMUNITY): Payer: Medicare HMO

## 2020-11-07 ENCOUNTER — Encounter (HOSPITAL_COMMUNITY): Payer: Self-pay | Admitting: Emergency Medicine

## 2020-11-07 ENCOUNTER — Inpatient Hospital Stay (HOSPITAL_COMMUNITY)
Admission: EM | Admit: 2020-11-07 | Discharge: 2020-11-09 | DRG: 917 | Disposition: A | Discharge status: Home / Self Care | Payer: Medicare HMO | Attending: Family Medicine | Admitting: Family Medicine

## 2020-11-07 ENCOUNTER — Other Ambulatory Visit: Payer: Self-pay

## 2020-11-07 DIAGNOSIS — Z7952 Long term (current) use of systemic steroids: Secondary | ICD-10-CM

## 2020-11-07 DIAGNOSIS — Z79899 Other long term (current) drug therapy: Secondary | ICD-10-CM

## 2020-11-07 DIAGNOSIS — J440 Chronic obstructive pulmonary disease with acute lower respiratory infection: Secondary | ICD-10-CM | POA: Diagnosis present

## 2020-11-07 DIAGNOSIS — R768 Other specified abnormal immunological findings in serum: Secondary | ICD-10-CM

## 2020-11-07 DIAGNOSIS — J680 Bronchitis and pneumonitis due to chemicals, gases, fumes and vapors: Secondary | ICD-10-CM | POA: Diagnosis present

## 2020-11-07 DIAGNOSIS — T59811A Toxic effect of smoke, accidental (unintentional), initial encounter: Principal | ICD-10-CM | POA: Diagnosis present

## 2020-11-07 DIAGNOSIS — R7689 Other specified abnormal immunological findings in serum: Secondary | ICD-10-CM

## 2020-11-07 DIAGNOSIS — Z7951 Long term (current) use of inhaled steroids: Secondary | ICD-10-CM

## 2020-11-07 DIAGNOSIS — J449 Chronic obstructive pulmonary disease, unspecified: Secondary | ICD-10-CM | POA: Diagnosis not present

## 2020-11-07 DIAGNOSIS — Z87892 Personal history of anaphylaxis: Secondary | ICD-10-CM

## 2020-11-07 DIAGNOSIS — R296 Repeated falls: Secondary | ICD-10-CM

## 2020-11-07 DIAGNOSIS — Z87891 Personal history of nicotine dependence: Secondary | ICD-10-CM

## 2020-11-07 DIAGNOSIS — R0600 Dyspnea, unspecified: Secondary | ICD-10-CM

## 2020-11-07 DIAGNOSIS — J9601 Acute respiratory failure with hypoxia: Secondary | ICD-10-CM | POA: Diagnosis present

## 2020-11-07 DIAGNOSIS — E871 Hypo-osmolality and hyponatremia: Secondary | ICD-10-CM

## 2020-11-07 DIAGNOSIS — J189 Pneumonia, unspecified organism: Secondary | ICD-10-CM | POA: Diagnosis present

## 2020-11-07 DIAGNOSIS — J069 Acute upper respiratory infection, unspecified: Secondary | ICD-10-CM

## 2020-11-07 DIAGNOSIS — J705 Respiratory conditions due to smoke inhalation: Secondary | ICD-10-CM | POA: Diagnosis present

## 2020-11-07 DIAGNOSIS — E7800 Pure hypercholesterolemia, unspecified: Secondary | ICD-10-CM | POA: Diagnosis present

## 2020-11-07 DIAGNOSIS — Z7982 Long term (current) use of aspirin: Secondary | ICD-10-CM

## 2020-11-07 DIAGNOSIS — I10 Essential (primary) hypertension: Secondary | ICD-10-CM

## 2020-11-07 DIAGNOSIS — Z91030 Bee allergy status: Secondary | ICD-10-CM

## 2020-11-07 DIAGNOSIS — I442 Atrioventricular block, complete: Secondary | ICD-10-CM | POA: Diagnosis present

## 2020-11-07 DIAGNOSIS — I5032 Chronic diastolic (congestive) heart failure: Secondary | ICD-10-CM | POA: Diagnosis present

## 2020-11-07 DIAGNOSIS — Z20822 Contact with and (suspected) exposure to covid-19: Secondary | ICD-10-CM | POA: Diagnosis present

## 2020-11-07 DIAGNOSIS — I110 Hypertensive heart disease with heart failure: Secondary | ICD-10-CM | POA: Diagnosis present

## 2020-11-07 DIAGNOSIS — Z8701 Personal history of pneumonia (recurrent): Secondary | ICD-10-CM

## 2020-11-07 DIAGNOSIS — Z950 Presence of cardiac pacemaker: Secondary | ICD-10-CM | POA: Diagnosis present

## 2020-11-07 HISTORY — DX: Heart failure, unspecified: I50.9

## 2020-11-07 LAB — CBC WITH DIFFERENTIAL/PLATELET
Band Neutrophils: 33 %
Basophils Absolute: 0 10*3/uL (ref 0.0–0.1)
Basophils Relative: 0 %
Eosinophils Absolute: 0 10*3/uL (ref 0.0–0.5)
Eosinophils Relative: 0 %
HCT: 40.5 % (ref 39.0–52.0)
Hemoglobin: 13.7 g/dL (ref 13.0–17.0)
Lymphocytes Relative: 4 %
Lymphs Abs: 0.3 10*3/uL — ABNORMAL LOW (ref 0.7–4.0)
MCH: 36.1 pg — ABNORMAL HIGH (ref 26.0–34.0)
MCHC: 33.8 g/dL (ref 30.0–36.0)
MCV: 106.9 fL — ABNORMAL HIGH (ref 80.0–100.0)
Metamyelocytes Relative: 6 %
Monocytes Absolute: 0.2 10*3/uL (ref 0.1–1.0)
Monocytes Relative: 3 %
Myelocytes: 4 %
Neutro Abs: 6.6 10*3/uL (ref 1.7–7.7)
Neutrophils Relative %: 50 %
Platelets: 251 10*3/uL (ref 150–400)
RBC: 3.79 MIL/uL — ABNORMAL LOW (ref 4.22–5.81)
RDW: 13.2 % (ref 11.5–15.5)
WBC: 8 10*3/uL (ref 4.0–10.5)
nRBC: 0 % (ref 0.0–0.2)

## 2020-11-07 LAB — BASIC METABOLIC PANEL
Anion gap: 10 (ref 5–15)
BUN: 15 mg/dL (ref 8–23)
CO2: 22 mmol/L (ref 22–32)
Calcium: 8.5 mg/dL — ABNORMAL LOW (ref 8.9–10.3)
Chloride: 96 mmol/L — ABNORMAL LOW (ref 98–111)
Creatinine, Ser: 1.08 mg/dL (ref 0.61–1.24)
GFR, Estimated: >60 mL/min (ref >60)
Glucose, Bld: 186 mg/dL — ABNORMAL HIGH (ref 70–99)
Potassium: 3.8 mmol/L (ref 3.5–5.1)
Sodium: 128 mmol/L — ABNORMAL LOW (ref 135–145)

## 2020-11-07 LAB — PROCALCITONIN: Procalcitonin: 2.72 ng/mL

## 2020-11-07 LAB — RESP PANEL BY RT-PCR (FLU A&B, COVID) ARPGX2
Influenza A by PCR: NEGATIVE
Influenza B by PCR: NEGATIVE
SARS Coronavirus 2 by RT PCR: NEGATIVE

## 2020-11-07 LAB — T4, FREE: Free T4: 1 ng/dL (ref 0.61–1.12)

## 2020-11-07 LAB — BRAIN NATRIURETIC PEPTIDE: B Natriuretic Peptide: 602 pg/mL — ABNORMAL HIGH (ref 0.0–100.0)

## 2020-11-07 LAB — TSH: TSH: 0.772 u[IU]/mL (ref 0.350–4.500)

## 2020-11-07 MED ORDER — SODIUM CHLORIDE 0.9 % IV SOLN
500.0000 mg | Freq: Once | INTRAVENOUS | Status: AC
  Administered 2020-11-07: 500 mg via INTRAVENOUS
  Filled 2020-11-07: qty 500

## 2020-11-07 MED ORDER — ONDANSETRON HCL 4 MG PO TABS: 4.0000 mg | ORAL_TABLET | Freq: Four times a day (QID) | ORAL | Status: DC | PRN

## 2020-11-07 MED ORDER — ASPIRIN EC 81 MG PO TBEC
81.0000 mg | DELAYED_RELEASE_TABLET | Freq: Every day | ORAL | Status: DC
  Administered 2020-11-07 – 2020-11-08 (×2): 81 mg via ORAL
  Filled 2020-11-07 (×2): qty 1

## 2020-11-07 MED ORDER — ALBUTEROL SULFATE (2.5 MG/3ML) 0.083% IN NEBU
INHALATION_SOLUTION | RESPIRATORY_TRACT | Status: AC
  Administered 2020-11-07: 2.5 mg
  Filled 2020-11-07: qty 3

## 2020-11-07 MED ORDER — ONDANSETRON HCL 4 MG/2ML IJ SOLN: 4.0000 mg | Freq: Four times a day (QID) | INTRAMUSCULAR | Status: DC | PRN

## 2020-11-07 MED ORDER — FLUTICASONE-UMECLIDIN-VILANT 100-62.5-25 MCG/INH IN AEPB: 1.0000 | INHALATION_SPRAY | Freq: Every day | RESPIRATORY_TRACT | Status: DC

## 2020-11-07 MED ORDER — METHYLPREDNISOLONE SODIUM SUCC 125 MG IJ SOLR
125.0000 mg | Freq: Two times a day (BID) | INTRAMUSCULAR | Status: AC
Start: 2020-11-08 — End: 2020-11-09
  Administered 2020-11-08 – 2020-11-09 (×3): 125 mg via INTRAVENOUS
  Filled 2020-11-07 (×4): qty 2

## 2020-11-07 MED ORDER — AZITHROMYCIN 250 MG PO TABS
500.0000 mg | ORAL_TABLET | Freq: Every day | ORAL | Status: DC
  Administered 2020-11-08 – 2020-11-09 (×2): 500 mg via ORAL
  Filled 2020-11-07 (×2): qty 2

## 2020-11-07 MED ORDER — IOHEXOL 350 MG/ML SOLN
60.0000 mL | Freq: Once | INTRAVENOUS | Status: AC | PRN
  Administered 2020-11-07: 60 mL via INTRAVENOUS

## 2020-11-07 MED ORDER — ALBUTEROL (5 MG/ML) CONTINUOUS INHALATION SOLN
15.0000 mg/h | INHALATION_SOLUTION | Freq: Once | RESPIRATORY_TRACT | Status: AC
  Administered 2020-11-07: 15 mg/h via RESPIRATORY_TRACT
  Filled 2020-11-07: qty 20

## 2020-11-07 MED ORDER — BISOPROLOL FUMARATE 5 MG PO TABS
5.0000 mg | ORAL_TABLET | Freq: Every day | ORAL | Status: DC
  Administered 2020-11-07 – 2020-11-09 (×3): 5 mg via ORAL
  Filled 2020-11-07 (×3): qty 1

## 2020-11-07 MED ORDER — IPRATROPIUM-ALBUTEROL 0.5-2.5 (3) MG/3ML IN SOLN
3.0000 mL | RESPIRATORY_TRACT | Status: DC | PRN
  Administered 2020-11-07: 3 mL via RESPIRATORY_TRACT
  Filled 2020-11-07: qty 3

## 2020-11-07 MED ORDER — MELATONIN 3 MG PO TABS: 6.0000 mg | ORAL_TABLET | Freq: Every evening | ORAL | Status: DC | PRN

## 2020-11-07 MED ORDER — UMECLIDINIUM BROMIDE 62.5 MCG/INH IN AEPB
1.0000 | INHALATION_SPRAY | Freq: Every day | RESPIRATORY_TRACT | Status: DC
  Administered 2020-11-08 – 2020-11-09 (×2): 1 via RESPIRATORY_TRACT
  Filled 2020-11-07: qty 7

## 2020-11-07 MED ORDER — SODIUM CHLORIDE 0.9 % IV SOLN
2.0000 g | INTRAVENOUS | Status: DC
  Administered 2020-11-08 – 2020-11-09 (×2): 2 g via INTRAVENOUS
  Filled 2020-11-07 (×2): qty 20

## 2020-11-07 MED ORDER — PANTOPRAZOLE SODIUM 40 MG PO TBEC
40.0000 mg | DELAYED_RELEASE_TABLET | Freq: Every day | ORAL | Status: DC
  Administered 2020-11-07 – 2020-11-09 (×3): 40 mg via ORAL
  Filled 2020-11-07 (×3): qty 1

## 2020-11-07 MED ORDER — METHYLPREDNISOLONE SODIUM SUCC 125 MG IJ SOLR
125.0000 mg | Freq: Once | INTRAMUSCULAR | Status: AC
  Administered 2020-11-07: 125 mg via INTRAVENOUS

## 2020-11-07 MED ORDER — SODIUM CHLORIDE 0.9 % IV SOLN
1.0000 g | Freq: Once | INTRAVENOUS | Status: AC
  Administered 2020-11-07: 1 g via INTRAVENOUS
  Filled 2020-11-07: qty 10

## 2020-11-07 MED ORDER — IPRATROPIUM-ALBUTEROL 0.5-2.5 (3) MG/3ML IN SOLN
3.0000 mL | Freq: Four times a day (QID) | RESPIRATORY_TRACT | Status: DC
  Administered 2020-11-08 – 2020-11-09 (×6): 3 mL via RESPIRATORY_TRACT
  Filled 2020-11-07 (×6): qty 3

## 2020-11-07 MED ORDER — METHYLPREDNISOLONE SODIUM SUCC 125 MG IJ SOLR
125.0000 mg | Freq: Once | INTRAMUSCULAR | Status: AC
Start: 2020-11-07 — End: 2020-11-07
  Administered 2020-11-07: 125 mg via INTRAVENOUS
  Filled 2020-11-07: qty 2

## 2020-11-07 MED ORDER — ATORVASTATIN CALCIUM 40 MG PO TABS
40.0000 mg | ORAL_TABLET | Freq: Every day | ORAL | Status: DC
  Administered 2020-11-07 – 2020-11-08 (×2): 40 mg via ORAL
  Filled 2020-11-07 (×3): qty 1

## 2020-11-07 MED ORDER — SODIUM CHLORIDE 0.9 % IV SOLN: 1.0000 g | INTRAVENOUS | Status: DC

## 2020-11-07 MED ORDER — IPRATROPIUM-ALBUTEROL 0.5-2.5 (3) MG/3ML IN SOLN: 3.0000 mL | Freq: Once | RESPIRATORY_TRACT | Status: DC

## 2020-11-07 MED ORDER — ACETAMINOPHEN 650 MG RE SUPP: 650.0000 mg | Freq: Four times a day (QID) | RECTAL | Status: DC | PRN

## 2020-11-07 MED ORDER — ACETAMINOPHEN 325 MG PO TABS: 650.0000 mg | ORAL_TABLET | Freq: Four times a day (QID) | ORAL | Status: DC | PRN

## 2020-11-07 MED ORDER — FLUTICASONE FUROATE-VILANTEROL 100-25 MCG/INH IN AEPB
1.0000 | INHALATION_SPRAY | Freq: Every day | RESPIRATORY_TRACT | Status: DC
  Administered 2020-11-08 – 2020-11-09 (×2): 1 via RESPIRATORY_TRACT
  Filled 2020-11-07: qty 28

## 2020-11-07 MED ORDER — PREDNISONE 20 MG PO TABS: 20.0000 mg | ORAL_TABLET | Freq: Every morning | ORAL | Status: DC

## 2020-11-07 NOTE — Assessment & Plan Note (Signed)
Wife states pt has been on daily prednisone for at least 3 years now. This was before pt was seeing Dr. Andrey Spearman just started seeing him in June 2022). Discussed long term health consequences of prolonged steroid use.  If pulmonary disease continues to improve, will need to consider outpatient steroid taper.

## 2020-11-07 NOTE — Assessment & Plan Note (Signed)
Stable. Pt with PPM.

## 2020-11-07 NOTE — Assessment & Plan Note (Signed)
Unclear the cause of pt's hyponatremia. Will check TSH, FT4.  Will update his echo. BNP is elevated.  Hold off on IVF for now.

## 2020-11-07 NOTE — Plan of Care (Signed)

## 2020-11-07 NOTE — ED Notes (Signed)
Attempted to call report again first time I  got sent to wrong nurse and second time ,  I got put on hold and no answer.

## 2020-11-07 NOTE — Assessment & Plan Note (Signed)
Being managed as outpatient by Dr. [name] with pulmonary.

## 2020-11-07 NOTE — Assessment & Plan Note (Signed)
Admit to medical bed. Continue with supplemental O2.

## 2020-11-07 NOTE — Progress Notes (Signed)
CTA reviewed. Has bilateral opacities but greatest in left lung. Still favoring inhalation pneumonitis. Will start IV steroids. Stop home dose prednisone. Will need repeat imaging in 24-48 hours to see if steroids are making an improvement.
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
## 2020-11-07 NOTE — Assessment & Plan Note (Signed)
Wife states pt has upcoming allergist appointment this month to discussed pt's elevated IgE level that was obtained in 08-2020. Pt referred to allergy by Dr. [name] with pulmonary.

## 2020-11-07 NOTE — ED Notes (Signed)
Attempted to call report to [facility]. She will have to call back.

## 2020-11-07 NOTE — ED Triage Notes (Signed)
Pt c/o SOB that began last night. States he has COPD. Denies CP, fever, or chills.

## 2020-11-07 NOTE — Assessment & Plan Note (Signed)
Stable
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
## 2020-11-07 NOTE — Assessment & Plan Note (Signed)
Stable. On bisoprolol. Wife states pt's breathing has improved since stopping coreg and changing to bisoprolol.

## 2020-11-07 NOTE — Assessment & Plan Note (Signed)
Wife states pt has frequent falls. Pt is not on systemic anticoagulants. Wife states pt being referred to academic neurologist since their local neurologist cannot determine the reason for pt's frequent dizziness/falls.

## 2020-11-07 NOTE — ED Notes (Signed)
2 lpm Nasal cannula applied for comfort.
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
## 2020-11-07 NOTE — ED Notes (Signed)
RT called for neb tx.
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
## 2020-11-08 ENCOUNTER — Observation Stay (HOSPITAL_COMMUNITY): Payer: Medicare HMO

## 2020-11-08 DIAGNOSIS — Z91030 Bee allergy status: Secondary | ICD-10-CM | POA: Diagnosis not present

## 2020-11-08 DIAGNOSIS — J705 Respiratory conditions due to smoke inhalation: Secondary | ICD-10-CM | POA: Diagnosis present

## 2020-11-08 DIAGNOSIS — Z20822 Contact with and (suspected) exposure to covid-19: Secondary | ICD-10-CM | POA: Diagnosis present

## 2020-11-08 DIAGNOSIS — R0609 Other forms of dyspnea: Secondary | ICD-10-CM

## 2020-11-08 DIAGNOSIS — J189 Pneumonia, unspecified organism: Secondary | ICD-10-CM | POA: Diagnosis present

## 2020-11-08 DIAGNOSIS — Z87892 Personal history of anaphylaxis: Secondary | ICD-10-CM | POA: Diagnosis not present

## 2020-11-08 DIAGNOSIS — T59811A Toxic effect of smoke, accidental (unintentional), initial encounter: Secondary | ICD-10-CM | POA: Diagnosis present

## 2020-11-08 DIAGNOSIS — E7800 Pure hypercholesterolemia, unspecified: Secondary | ICD-10-CM | POA: Diagnosis present

## 2020-11-08 DIAGNOSIS — R296 Repeated falls: Secondary | ICD-10-CM | POA: Diagnosis present

## 2020-11-08 DIAGNOSIS — Z7952 Long term (current) use of systemic steroids: Secondary | ICD-10-CM | POA: Diagnosis not present

## 2020-11-08 DIAGNOSIS — E871 Hypo-osmolality and hyponatremia: Secondary | ICD-10-CM | POA: Diagnosis present

## 2020-11-08 DIAGNOSIS — J440 Chronic obstructive pulmonary disease with acute lower respiratory infection: Secondary | ICD-10-CM | POA: Diagnosis present

## 2020-11-08 DIAGNOSIS — Z87891 Personal history of nicotine dependence: Secondary | ICD-10-CM | POA: Diagnosis not present

## 2020-11-08 DIAGNOSIS — J069 Acute upper respiratory infection, unspecified: Secondary | ICD-10-CM | POA: Diagnosis present

## 2020-11-08 DIAGNOSIS — Z79899 Other long term (current) drug therapy: Secondary | ICD-10-CM | POA: Diagnosis not present

## 2020-11-08 DIAGNOSIS — Z950 Presence of cardiac pacemaker: Secondary | ICD-10-CM | POA: Diagnosis not present

## 2020-11-08 DIAGNOSIS — Z8701 Personal history of pneumonia (recurrent): Secondary | ICD-10-CM | POA: Diagnosis not present

## 2020-11-08 DIAGNOSIS — I110 Hypertensive heart disease with heart failure: Secondary | ICD-10-CM | POA: Diagnosis present

## 2020-11-08 DIAGNOSIS — J680 Bronchitis and pneumonitis due to chemicals, gases, fumes and vapors: Secondary | ICD-10-CM | POA: Diagnosis present

## 2020-11-08 DIAGNOSIS — I442 Atrioventricular block, complete: Secondary | ICD-10-CM | POA: Diagnosis present

## 2020-11-08 DIAGNOSIS — Z7982 Long term (current) use of aspirin: Secondary | ICD-10-CM | POA: Diagnosis not present

## 2020-11-08 DIAGNOSIS — Z7951 Long term (current) use of inhaled steroids: Secondary | ICD-10-CM | POA: Diagnosis not present

## 2020-11-08 DIAGNOSIS — J9601 Acute respiratory failure with hypoxia: Secondary | ICD-10-CM | POA: Diagnosis present

## 2020-11-08 DIAGNOSIS — I5032 Chronic diastolic (congestive) heart failure: Secondary | ICD-10-CM | POA: Diagnosis present

## 2020-11-08 LAB — CBC WITH DIFFERENTIAL/PLATELET
Band Neutrophils: 38 %
Basophils Absolute: 0 10*3/uL (ref 0.0–0.1)
Basophils Relative: 0 %
Eosinophils Absolute: 0 10*3/uL (ref 0.0–0.5)
Eosinophils Relative: 0 %
HCT: 36.6 % — ABNORMAL LOW (ref 39.0–52.0)
Hemoglobin: 12.6 g/dL — ABNORMAL LOW (ref 13.0–17.0)
Lymphocytes Relative: 4 %
Lymphs Abs: 0.6 10*3/uL — ABNORMAL LOW (ref 0.7–4.0)
MCH: 36.4 pg — ABNORMAL HIGH (ref 26.0–34.0)
MCHC: 34.4 g/dL (ref 30.0–36.0)
MCV: 105.8 fL — ABNORMAL HIGH (ref 80.0–100.0)
Metamyelocytes Relative: 3 %
Monocytes Absolute: 0 10*3/uL — ABNORMAL LOW (ref 0.1–1.0)
Monocytes Relative: 0 %
Myelocytes: 1 %
Neutro Abs: 14.4 10*3/uL — ABNORMAL HIGH (ref 1.7–7.7)
Neutrophils Relative %: 54 %
Platelets: 202 10*3/uL (ref 150–400)
RBC: 3.46 MIL/uL — ABNORMAL LOW (ref 4.22–5.81)
RDW: 13.1 % (ref 11.5–15.5)
WBC Morphology: INCREASED
WBC: 15.7 10*3/uL — ABNORMAL HIGH (ref 4.0–10.5)
nRBC: 0 % (ref 0.0–0.2)

## 2020-11-08 LAB — MAGNESIUM: Magnesium: 1.8 mg/dL (ref 1.7–2.4)

## 2020-11-08 LAB — ECHOCARDIOGRAM COMPLETE
AR max vel: 2.34 cm2
AV Area VTI: 2.61 cm2
AV Area mean vel: 1.92 cm2
AV Mean grad: 5 mmHg
AV Peak grad: 7.5 mmHg
Ao pk vel: 1.37 m/s
Area-P 1/2: 3.53 cm2
Height: 72 in
MV VTI: 2.07 cm2
S' Lateral: 3.38 cm
Weight: 3079.39 oz

## 2020-11-08 LAB — COMPREHENSIVE METABOLIC PANEL
ALT: 30 U/L (ref 0–44)
AST: 23 U/L (ref 15–41)
Albumin: 3 g/dL — ABNORMAL LOW (ref 3.5–5.0)
Alkaline Phosphatase: 53 U/L (ref 38–126)
Anion gap: 11 (ref 5–15)
BUN: 15 mg/dL (ref 8–23)
CO2: 25 mmol/L (ref 22–32)
Calcium: 9.3 mg/dL (ref 8.9–10.3)
Chloride: 98 mmol/L (ref 98–111)
Creatinine, Ser: 0.83 mg/dL (ref 0.61–1.24)
GFR, Estimated: >60 mL/min (ref >60)
Glucose, Bld: 182 mg/dL — ABNORMAL HIGH (ref 70–99)
Potassium: 4.5 mmol/L (ref 3.5–5.1)
Sodium: 134 mmol/L — ABNORMAL LOW (ref 135–145)
Total Bilirubin: 0.9 mg/dL (ref 0.3–1.2)
Total Protein: 6.3 g/dL — ABNORMAL LOW (ref 6.5–8.1)

## 2020-11-08 LAB — PROCALCITONIN: Procalcitonin: 11.94 ng/mL

## 2020-11-08 NOTE — Progress Notes (Signed)
*  PRELIMINARY RESULTS* Echocardiogram 2D Echocardiogram has been performed.  Carolyne Fiscal 11/08/2020, 11:48 AM

## 2020-11-08 NOTE — Progress Notes (Signed)
Patient Demographics:    Eric Diaz, is a 75 y.o. male, DOB - 1945-10-29, EUM:[id number]  Admit date - 11/07/2020   Admitting Physician Roosevelt Eimers Mariea Clonts, MD  Outpatient Primary MD for the patient is Lipham, Doreene Adas, DO  LOS - 0   Chief Complaint  Patient presents with   Shortness of Breath        Subjective:    Eric Diaz today has no fevers, no emesis,  No chest pain,   Wife at bedside   Assessment  & Plan :    Principal Problem:   Acute respiratory failure with hypoxia (HCC) Active Problems:   Complete heart block (HCC)   CAP (community acquired pneumonia) vs smoke inhalation pneumonitis   Essential hypertension   Pacemaker   COPD GOLD ?    Current chronic use of systemic steroids - daily prednisone since 2019   Elevated IgE level   Frequent falls   Hyponatremia   PNA (pneumonia)  Brief Summary:- 75 year old male with a history of hypertension, history of permanent pacemaker secondary to complete heart block, history of frequent falls, COPD admitted with CAP Vs inhalation pneumonitis  A/p 1)Acute hypoxic respiratory failure--secondary to CAP with superimposed smoke inhalation pneumonitis -PCT 2.72 >>11.94 WBC 8.0 >>15.7 (steroids) -Continue supplemental oxygen -Continue Rocephin, azithromycin bronchodilators and IV Solu-Medrol -Echo with preserved EF of 55 to 60% -Attempt to wean off O2 in a.m.  2)H/o CHB--status post PPM--- apparently patient has not had respiratory issues with bisoprolol unlike other beta-blockers which she has used in the past we will continue bisoprolol at this time  3)COPD--steroid-dependent, currently on IV Solu-Medrol as above #1  4)Elevated IgE level Wife states pt has upcoming allergist appointment this month to discussed pt's elevated IgE level that was obtained in 08-2020. Pt referred to allergy by Dr. [name] with pulmonary.  5)Hyponatremia---  improving, avoid excessive free water  6)HFpEF--- -Echo with preserved EF of 55 to 60% BNP 151>>602---   Disposition/Need for in-Hospital Stay- patient unable to be discharged at this time due to --acute hypoxic respiratory failure with suspicion for CAP with superimposed smoking elation pneumonitis requiring IV antibiotics and IV steroids  Status is: Inpatient  Remains inpatient appropriate because: See disposition above  Disposition: The patient is from: Home              Anticipated d/c is to: Home              Anticipated d/c date is: 1 day              Patient currently is not medically stable to d/c. Barriers: Not Clinically Stable-   Code Status :  -  Code Status: Full Code   Family Communication:    NA (patient is alert, awake and coherent)  Discussed with wife at bedside Consults  :  na  DVT Prophylaxis  :   - SCDs  SCDs Start: 11/07/20 1641    Lab Results  Component Value Date   PLT 202 11/08/2020    Inpatient Medications  Scheduled Meds:  aspirin EC  81 mg Oral QHS   atorvastatin  40 mg Oral QHS   azithromycin  500 mg Oral Daily   bisoprolol  5 mg Oral Daily   fluticasone  furoate-vilanterol  1 puff Inhalation Daily   And   umeclidinium bromide  1 puff Inhalation Daily   ipratropium-albuterol  3 mL Nebulization Q6H   methylPREDNISolone (SOLU-MEDROL) injection  125 mg Intravenous Q12H   pantoprazole  40 mg Oral Daily   Continuous Infusions:  cefTRIAXone (ROCEPHIN)  IV Stopped (11/08/20 0054)   PRN Meds:.acetaminophen **OR** acetaminophen, ipratropium-albuterol, melatonin, ondansetron **OR** ondansetron (ZOFRAN) IV    Anti-infectives (From admission, onward)    Start     Dose/Rate Route Frequency Ordered Stop   11/08/20 1000  azithromycin (ZITHROMAX) tablet 500 mg        500 mg Oral Daily 11/07/20 1641     11/08/20 0000  cefTRIAXone (ROCEPHIN) 1 g in sodium chloride 0.9 % 100 mL IVPB  Status:  Discontinued        1 g 200 mL/hr over 30 Minutes  Intravenous Every 24 hours 11/07/20 1641 11/07/20 1647   11/08/20 0000  cefTRIAXone (ROCEPHIN) 2 g in sodium chloride 0.9 % 100 mL IVPB        2 g 200 mL/hr over 30 Minutes Intravenous Every 24 hours 11/07/20 1647 01/16/23 2359   11/07/20 1500  cefTRIAXone (ROCEPHIN) 1 g in sodium chloride 0.9 % 100 mL IVPB        1 g 200 mL/hr over 30 Minutes Intravenous  Once 11/07/20 1456 11/07/20 1636   11/07/20 1500  azithromycin (ZITHROMAX) 500 mg in sodium chloride 0.9 % 250 mL IVPB        500 mg 250 mL/hr over 60 Minutes Intravenous  Once 11/07/20 1456 11/07/20 1640         Objective:   Vitals:   11/08/20 0500 11/08/20 0624 11/08/20 0850 11/08/20 1443  BP:  132/64  129/64  Pulse:  98  (!) 106  Resp:  20  18  Temp:  98.2 F (36.8 C)  97.9 F (36.6 C)  TempSrc:      SpO2:  97% 96% 97%  Weight: 87.3 kg     Height:        Wt Readings from Last 3 Encounters:  11/08/20 87.3 kg  10/06/20 88 kg  09/10/20 84.8 kg     Intake/Output Summary (Last 24 hours) at 11/08/2020 1804 Last data filed at 11/08/2020 1700 Gross per 24 hour  Intake 1030 ml  Output 1400 ml  Net -370 ml     Physical Exam  Gen:- Awake Alert,  in no apparent distress  HEENT:- [place].AT, No sclera icterus Neck-Supple Neck,No JVD,.  Lungs-prolonged abdomen, no significant wheezing few scattered rhonchi noted  CV- S1, S2 normal, regular , PPM in situ Abd-  +ve B.Sounds, Abd Soft, No tenderness,    Extremity/Skin:- No  edema, pedal pulses present  Psych-affect is appropriate, oriented x3 Neuro-no new focal deficits, no tremors   Data Review:   Micro Results Recent Results (from the past 240 hour(s))  Resp Panel by RT-PCR (Flu A&B, Covid) Nasopharyngeal Swab     Status: None   Collection Time: 11/07/20  1:57 PM   Specimen: Nasopharyngeal Swab; Nasopharyngeal(NP) swabs in vial transport medium  Result Value Ref Range Status   SARS Coronavirus 2 by RT PCR NEGATIVE NEGATIVE Final    Comment: (NOTE) SARS-CoV-2 target  nucleic acids are NOT DETECTED.  The SARS-CoV-2 RNA is generally detectable in upper respiratory specimens during the acute phase of infection. The lowest concentration of SARS-CoV-2 viral copies this assay can detect is 138 copies/mL. A negative result does not preclude SARS-Cov-2 infection and  should not be used as the sole basis for treatment or other patient management decisions. A negative result may occur with  improper specimen collection/handling, submission of specimen other than nasopharyngeal swab, presence of viral mutation(s) within the areas targeted by this assay, and inadequate number of viral copies(<138 copies/mL). A negative result must be combined with clinical observations, patient history, and epidemiological information. The expected result is Negative.  Fact Sheet for Patients:  BloggerCourse.com  Fact Sheet for Healthcare Providers:  SeriousBroker.it  This test is no t yet approved or cleared by the [place] FDA and  has been authorized for detection and/or diagnosis of SARS-CoV-2 by FDA under an Emergency Use Authorization (EUA). This EUA will remain  in effect (meaning this test can be used) for the duration of the COVID-19 declaration under Section 564(b)(1) of the Act, 21 U.S.C.section 360bbb-3(b)(1), unless the authorization is terminated  or revoked sooner.       Influenza A by PCR NEGATIVE NEGATIVE Final   Influenza B by PCR NEGATIVE NEGATIVE Final    Comment: (NOTE) The Xpert Xpress SARS-CoV-2/FLU/RSV plus assay is intended as an aid in the diagnosis of influenza from Nasopharyngeal swab specimens and should not be used as a sole basis for treatment. Nasal washings and aspirates are unacceptable for Xpert Xpress SARS-CoV-2/FLU/RSV testing.  Fact Sheet for Patients: BloggerCourse.com  Fact Sheet for Healthcare  Providers: SeriousBroker.it  This test is not yet approved or cleared by the [place] FDA and has been authorized for detection and/or diagnosis of SARS-CoV-2 by FDA under an Emergency Use Authorization (EUA). This EUA will remain in effect (meaning this test can be used) for the duration of the COVID-19 declaration under Section 564(b)(1) of the Act, 21 U.S.C. section 360bbb-3(b)(1), unless the authorization is terminated or revoked.  Performed at [facility], [address] [place], [place] [address]     Radiology Reports CT Angio Chest PE W and/or Wo Contrast  Result Date: 11/07/2020 CLINICAL DATA:  Short of breath since last evening, COPD EXAM: CT ANGIOGRAPHY CHEST WITH CONTRAST TECHNIQUE: Multidetector CT imaging of the chest was performed using the standard protocol during bolus administration of intravenous contrast. Multiplanar CT image reconstructions and MIPs were obtained to evaluate the vascular anatomy. CONTRAST:  60mL OMNIPAQUE IOHEXOL 350 MG/ML SOLN COMPARISON:  11/07/2020 FINDINGS: Cardiovascular: This is a technically adequate evaluation of the pulmonary vasculature. No filling defects or pulmonary emboli. The heart is unremarkable without pericardial effusion. Dual lead pacer identified. Normal caliber of the thoracic aorta without aneurysm or dissection. There is diffuse atherosclerosis of the aorta and coronary vessels. Mediastinum/Nodes: Multiple subcentimeter lymph nodes in the mediastinum and hila are likely reactive. Thyroid, trachea, and esophagus are unremarkable. Lungs/Pleura: Multifocal interstitial and ground-glass opacities are seen, greatest in the left upper lobe, consistent with atypical infection or edema. Trace left pleural effusion. No pneumothorax. Mild background emphysema. Central airways are patent. Upper Abdomen: No acute abnormality. Musculoskeletal: There is an age indeterminate compression deformity partially visualized  within the superior endplate of L1. No other acute bony abnormalities. Reconstructed images demonstrate no additional findings. Review of the MIP images confirms the above findings. IMPRESSION: 1. No evidence of pulmonary embolus. 2. Multifocal interstitial and ground-glass opacities, greatest in the left upper lobe, favor multifocal atypical pneumonia given clinical presentation. 3. Trace left pleural effusion. 4. Subcentimeter mediastinal and hilar lymph nodes, likely reactive. 5. Age indeterminate compression deformity superior endplate of the L1 vertebral body, incompletely evaluated on this study. 6. Aortic Atherosclerosis (ICD10-I70.0).  Coronary artery atherosclerosis. Electronically Signed   By: Sharlet SalinaMichael  Brown M.D.   On: 11/07/2020 16:47   DG Chest Portable 1 View  Addendum Date: 11/07/2020   ADDENDUM REPORT: 11/07/2020 15:10 ADDENDUM: These results were called by telephone at the time of interpretation on 11/07/2020 at 3:10 pm to provider [facility] , who verbally acknowledged these results. Electronically Signed   By: Donzetta KohutGeoffrey  Wile M.D.   On: 11/07/2020 15:10   Result Date: 11/07/2020 CLINICAL DATA:  Shortness of breath that began last night, history of COPD and hypertension. 75 year old male. EXAM: PORTABLE CHEST 1 VIEW COMPARISON:  August 31, 2020. FINDINGS: EKG leads project over the chest. LEFT-sided pacer device, power pack over LEFT chest with 2 leads projecting over the cardiac silhouette showing a similar appearance to prior imaging. Cardiomediastinal contours are stable. Interval development of marked increased interstitial and alveolar opacities particularly along the peripheral LEFT chest when compared to recent imaging. No lobar level consolidative process. Less pronounced interstitial and airspace changes on the RIGHT. LEFT-sided changes are more pronounced about the mid and upper chest. No visible pneumothorax. On limited assessment there is no acute skeletal process. IMPRESSION: Interval  development of marked increased interstitial and alveolar opacities along the peripheral LEFT chest. Findings are suspicious for multifocal pneumonia, unusual pattern of disease given peripheral location but diffuse distribution throughout the LEFT chest. Given subpleural location pulmonary infarct could potentially have a similar appearance. Less pronounced interstitial and airspace changes on the RIGHT. Electronically Signed: By: Donzetta KohutGeoffrey  Wile M.D. On: 11/07/2020 15:04   ECHOCARDIOGRAM COMPLETE  Result Date: 11/08/2020    ECHOCARDIOGRAM REPORT   Patient Name:   Leanne ChangDMOND T Tine Date of Exam: 11/08/2020 Medical Rec #:  [id number]        Height:       72.0 in Accession #:    [id number]       Weight:       192.5 lb Date of Birth:  [birth year]        BSA:          2.096 m Patient Age:    75 years         BP:           132/64 mmHg Patient Gender: M                HR:           101 bpm. [facility] Procedure: 2D Echo, Cardiac Doppler and Color Doppler Indications:    Dyspnea  History:        Patient has prior history of Echocardiogram examinations, most                 recent 05/22/2019. Pacemaker, COPD, Signs/Symptoms:Shortness of                 Breath; Risk Factors:Former Smoker and Hypertension.  Sonographer:    Mikki Harbororothy Buchanan Referring Phys: [phone] ERIC CHEN IMPRESSIONS  1. Left ventricular ejection fraction, by estimation, is 55 to 60%. The left ventricle has normal function. Left ventricular endocardial border not optimally defined to evaluate regional wall motion. There is mild left ventricular hypertrophy. Left ventricular diastolic parameters are indeterminate.  2. Right ventricular systolic function is normal. The right ventricular size is normal. There is mildly elevated pulmonary artery systolic pressure. The estimated right ventricular systolic pressure is 38.8 mmHg.  3. Left atrial size was upper normal.  4. The mitral valve is abnormal. Trivial mitral valve regurgitation. Mild  mitral  stenosis. The mean mitral valve gradient is 4.0 mmHg.  5. The aortic valve is tricuspid. Aortic valve regurgitation is not visualized. No aortic stenosis is present. Aortic valve mean gradient measures 5.0 mmHg.  6. The inferior vena cava is normal in size with greater than 50% respiratory variability, suggesting right atrial pressure of 3 mmHg. Comparison(s): Prior images reviewed side by side. No significant progression in valvular disease. FINDINGS  Left Ventricle: Left ventricular ejection fraction, by estimation, is 55 to 60%. The left ventricle has normal function. Left ventricular endocardial border not optimally defined to evaluate regional wall motion. The left ventricular internal cavity size was normal in size. There is mild left ventricular hypertrophy. Left ventricular diastolic parameters are indeterminate. Right Ventricle: The right ventricular size is normal. No increase in right ventricular wall thickness. Right ventricular systolic function is normal. There is mildly elevated pulmonary artery systolic pressure. The tricuspid regurgitant velocity is 2.99  m/s, and with an assumed right atrial pressure of 3 mmHg, the estimated right ventricular systolic pressure is 38.8 mmHg. Left Atrium: Left atrial size was upper normal. Right Atrium: Right atrial size was normal in size. Pericardium: There is no evidence of pericardial effusion. Presence of pericardial fat pad. Mitral Valve: The mitral valve is abnormal. There is moderate thickening of the mitral valve leaflet(s). There is mild calcification of the mitral valve leaflet(s). Trivial mitral valve regurgitation. Mild mitral valve stenosis. MV peak gradient, 18.3 mmHg. The mean mitral valve gradient is 4.0 mmHg. Tricuspid Valve: The tricuspid valve is grossly normal. Tricuspid valve regurgitation is trivial. Aortic Valve: The aortic valve is tricuspid. There is mild aortic valve annular calcification. Aortic valve regurgitation is not visualized. No  aortic stenosis is present. Aortic valve mean gradient measures 5.0 mmHg. Aortic valve peak gradient measures 7.5 mmHg. Aortic valve area, by VTI measures 2.61 cm. Pulmonic Valve: The pulmonic valve was not well visualized. Pulmonic valve regurgitation is trivial. Aorta: The aortic root is normal in size and structure. Venous: The inferior vena cava is normal in size with greater than 50% respiratory variability, suggesting right atrial pressure of 3 mmHg. IAS/Shunts: No atrial level shunt detected by color flow Doppler. Additional Comments: A device lead is visualized.  LEFT VENTRICLE PLAX 2D LVIDd:         4.56 cm  Diastology LVIDs:         3.38 cm  LV e' medial:    11.40 cm/s LV PW:         1.37 cm  LV E/e' medial:  17.2 LV IVS:        1.16 cm  LV e' lateral:   15.50 cm/s LVOT diam:     2.00 cm  LV E/e' lateral: 12.6 LV SV:         74 LV SV Index:   35 LVOT Area:     3.14 cm  RIGHT VENTRICLE RV Basal diam:  3.80 cm RV Mid diam:    2.88 cm RV S prime:     13.20 cm/s TAPSE (M-mode): 2.3 cm LEFT ATRIUM             Index       RIGHT ATRIUM           Index LA diam:        4.10 cm 1.96 cm/m  RA Area:     18.40 cm LA Vol (A2C):   69.2 ml 33.01 ml/m RA Volume:   48.50 ml  23.14 ml/m LA  Vol (A4C):   69.5 ml 33.16 ml/m LA Biplane Vol: 72.6 ml 34.64 ml/m  AORTIC VALVE AV Area (Vmax):    2.34 cm AV Area (Vmean):   1.92 cm AV Area (VTI):     2.61 cm AV Vmax:           137.00 cm/s AV Vmean:          105.000 cm/s AV VTI:            0.283 m AV Peak Grad:      7.5 mmHg AV Mean Grad:      5.0 mmHg LVOT Vmax:         102.00 cm/s LVOT Vmean:        64.300 cm/s LVOT VTI:          0.235 m LVOT/AV VTI ratio: 0.83  AORTA Ao Root diam: 3.60 cm MITRAL VALVE                TRICUSPID VALVE MV Area (PHT): 3.53 cm     TR Peak grad:   35.8 mmHg MV Area VTI:   2.07 cm     TR Vmax:        299.00 cm/s MV Peak grad:  18.3 mmHg MV Mean grad:  4.0 mmHg     SHUNTS MV Vmax:       2.14 m/s     Systemic VTI:  0.24 m MV Vmean:      69.9 cm/s     Systemic Diam: 2.00 cm MV Decel Time: 215 msec MV E velocity: 196.00 cm/s Nona Dell MD Electronically signed by Nona Dell MD Signature Date/Time: 11/08/2020/4:13:32 PM    Final      CBC Recent Labs  Lab 11/07/20 1406 11/08/20 0544  WBC 8.0 15.7*  HGB 13.7 12.6*  HCT 40.5 36.6*  PLT 251 202  MCV 106.9* 105.8*  MCH 36.1* 36.4*  MCHC 33.8 34.4  RDW 13.2 13.1  LYMPHSABS 0.3* 0.6*  MONOABS 0.2 0.0*  EOSABS 0.0 0.0  BASOSABS 0.0 0.0    Chemistries  Recent Labs  Lab 11/07/20 1406 11/08/20 0544  NA 128* 134*  K 3.8 4.5  CL 96* 98  CO2 22 25  GLUCOSE 186* 182*  BUN 15 15  CREATININE 1.08 0.83  CALCIUM 8.5* 9.3  MG  --  1.8  AST  --  23  ALT  --  30  ALKPHOS  --  53  BILITOT  --  0.9   ------------------------------------------------------------------------------------------------------------------ No results for input(s): CHOL, HDL, LDLCALC, TRIG, CHOLHDL, LDLDIRECT in the last 72 hours.  No results found for: HGBA1C ------------------------------------------------------------------------------------------------------------------ Recent Labs    11/07/20 1406  TSH 0.772   ------------------------------------------------------------------------------------------------------------------ No results for input(s): VITAMINB12, FOLATE, FERRITIN, TIBC, IRON, RETICCTPCT in the last 72 hours.  Coagulation profile No results for input(s): INR, PROTIME in the last 168 hours.  No results for input(s): DDIMER in the last 72 hours.  Cardiac Enzymes No results for input(s): CKMB, TROPONINI, MYOGLOBIN in the last 168 hours.  Invalid input(s): CK ------------------------------------------------------------------------------------------------------------------    Component Value Date/Time   BNP 602.0 (H) 11/07/2020 1357     Shon Hale M.D on 11/08/2020 at 6:04 PM  Go to [email or web address] - for contact info  [facility] - Office  [phone]

## 2020-11-08 NOTE — Progress Notes (Signed)
Pt arrived from [facility] in wheelchair on 2LPM of O2 via n/c. Spouse present at bedside. Pt was able to ambulate from wheelchair to bed with standby assist. Pt A/O x4 and follows commands. NAD. Breathing slightly labored after transfer, but settled quickly. Admission history provided by spouse and patient. Pt and spouse oriented to room and unit, educated on visitation policy, verbalized understanding. Call light within reach. Bed in low position. Safety measures intact.

## 2020-11-09 ENCOUNTER — Inpatient Hospital Stay (HOSPITAL_COMMUNITY): Payer: Medicare HMO

## 2020-11-09 ENCOUNTER — Encounter (HOSPITAL_COMMUNITY): Payer: Self-pay | Admitting: Family Medicine

## 2020-11-09 LAB — CBC
HCT: 35.2 % — ABNORMAL LOW (ref 39.0–52.0)
Hemoglobin: 12.1 g/dL — ABNORMAL LOW (ref 13.0–17.0)
MCH: 35.6 pg — ABNORMAL HIGH (ref 26.0–34.0)
MCHC: 34.4 g/dL (ref 30.0–36.0)
MCV: 103.5 fL — ABNORMAL HIGH (ref 80.0–100.0)
Platelets: 212 10*3/uL (ref 150–400)
RBC: 3.4 MIL/uL — ABNORMAL LOW (ref 4.22–5.81)
RDW: 12.6 % (ref 11.5–15.5)
WBC: 13.7 10*3/uL — ABNORMAL HIGH (ref 4.0–10.5)
nRBC: 0 % (ref 0.0–0.2)

## 2020-11-09 LAB — PROCALCITONIN: Procalcitonin: 8.05 ng/mL

## 2020-11-09 MED ORDER — PREDNISONE 20 MG PO TABS: 40.0000 mg | ORAL_TABLET | Freq: Every day | ORAL | 0 refills | Status: AC

## 2020-11-09 MED ORDER — ALBUTEROL SULFATE 2.5 MG/0.5ML IN NEBU: 2.5000 mg | INHALATION_SOLUTION | Freq: Two times a day (BID) | RESPIRATORY_TRACT | 3 refills | Status: AC | PRN

## 2020-11-09 MED ORDER — CEFDINIR 300 MG PO CAPS: 300.0000 mg | ORAL_CAPSULE | Freq: Two times a day (BID) | ORAL | 0 refills | Status: AC

## 2020-11-09 MED ORDER — AZITHROMYCIN 500 MG PO TABS: 500.0000 mg | ORAL_TABLET | Freq: Every day | ORAL | 0 refills | Status: AC

## 2020-11-09 MED ORDER — PANTOPRAZOLE SODIUM 40 MG PO TBEC: 40.0000 mg | DELAYED_RELEASE_TABLET | Freq: Every day | ORAL | 1 refills | Status: DC

## 2020-11-09 MED ORDER — ASPIRIN EC 81 MG PO TBEC: 81.0000 mg | DELAYED_RELEASE_TABLET | Freq: Every day | ORAL | 11 refills | Status: AC
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
## 2020-11-09 NOTE — Discharge Instructions (Signed)
1)follow up with Christena Deem, DO for recheck within 1 week 2)Repeat CBC and BMP Test with PCP within 1 week advised

## 2020-11-15 DIAGNOSIS — Z7409 Other reduced mobility: Secondary | ICD-10-CM | POA: Insufficient documentation

## 2020-11-15 DIAGNOSIS — K219 Gastro-esophageal reflux disease without esophagitis: Secondary | ICD-10-CM | POA: Insufficient documentation

## 2020-11-15 DIAGNOSIS — E538 Deficiency of other specified B group vitamins: Secondary | ICD-10-CM | POA: Insufficient documentation

## 2020-11-24 ENCOUNTER — Ambulatory Visit: Payer: Medicare HMO | Admitting: Allergy & Immunology

## 2020-11-24 ENCOUNTER — Other Ambulatory Visit: Payer: Self-pay

## 2020-11-24 VITALS — BP 138/80 | HR 91 | Temp 98.4°F | Resp 14 | Ht 72.0 in | Wt 191.4 lb

## 2020-11-24 DIAGNOSIS — J310 Chronic rhinitis: Secondary | ICD-10-CM

## 2020-11-24 DIAGNOSIS — J449 Chronic obstructive pulmonary disease, unspecified: Secondary | ICD-10-CM | POA: Diagnosis not present

## 2020-11-24 DIAGNOSIS — T63481D Toxic effect of venom of other arthropod, accidental (unintentional), subsequent encounter: Secondary | ICD-10-CM

## 2020-11-24 MED ORDER — EPINEPHRINE 0.3 MG/0.3ML IJ SOAJ: 0.3000 mg | Freq: Once | INTRAMUSCULAR | 2 refills | Status: AC

## 2020-11-24 MED ORDER — CARBINOXAMINE MALEATE 4 MG PO TABS: 4.0000 mg | ORAL_TABLET | Freq: Three times a day (TID) | ORAL | 5 refills | Status: DC | PRN

## 2020-11-24 MED ORDER — FORMOTEROL FUMARATE 20 MCG/2ML IN NEBU: 20.0000 ug | INHALATION_SOLUTION | Freq: Two times a day (BID) | RESPIRATORY_TRACT | 5 refills | Status: DC

## 2020-11-24 NOTE — Patient Instructions (Addendum)
1. Chronic rhinitis - Testing today showed: negative to the entire panel (we could do more sensitive intradermal testing in the future if needed) - Copy of test results provided.  - Avoidance measures provided. - Start taking:  carbinoxamine 4mg  every 8 hours as needed (can cause sleepiness) - You can use an extra dose of the antihistamine, if needed, for breakthrough symptoms.  - Consider nasal saline rinses 1-2 times daily to remove allergens from the nasal cavities as well as help with mucous clearance (this is especially helpful to do before the nasal sprays are given) - We are going to get blood work to confirm this.   2. Asthma-COPD overlap syndrome - Continue with Dr. . - We are not changing any of your breathing medications at all.   3. Insect sting allergy - We are getting a stinging insect panel to check for stinging insect allergy. - EpiPen training provided. - We will call you in 1-2 weeks with the results of the testing.   4. Return in about 3 months (around 02/23/2021).    Please inform 02/25/2021 of any Emergency Department visits, hospitalizations, or changes in symptoms. Call us before going to the ED for breathing or allergy symptoms since we might be able to fit you in for a sick visit. Feel free to contact us anytime with any questions, problems, or concerns.  It was a pleasure to meet you today!  Websites that have reliable patient information: 1. American Academy of Asthma, Allergy, and Immunology: www.aaaai.org 2. Food Allergy Research and Education (FARE): foodallergy.org 3. Mothers of Asthmatics: http://www.asthmacommunitynetwork.org 4. American College of Allergy, Asthma, and Immunology: www.acaai.org   COVID-19 Vaccine Information can be found at: [place] For questions related to vaccine distribution or appointments, please email [email or web address] or call [phone].   We realize that  you might be concerned about having an allergic reaction to the COVID19 vaccines. To help with that concern, WE ARE OFFERING THE COVID19 VACCINES IN OUR OFFICE! Ask the front desk for dates!     "Like" [phone] on Facebook and Instagram for our latest updates!      A healthy democracy works best when [place] participate! Make sure you are registered to vote! If you have moved or changed any of your contact information, you will need to get this updated before voting!  In some cases, you MAY be able to register to vote online: [employer]

## 2020-11-24 NOTE — Progress Notes (Signed)
NEW PATIENT  Date of Service/Encounter:  11/24/20  Consult requested by: Christena Deem, DO   Assessment:   Chronic rhinitis   Asthma-COPD overlap syndrome  Insect sting allergy - getting labs today  Plan/Recommendations:   1. Chronic rhinitis - Testing today showed: negative to the entire panel (we could do more sensitive intradermal testing in the future if needed) - Copy of test results provided.  - Avoidance measures provided. - Start taking:  carbinoxamine 4mg  every 8 hours as needed (can cause sleepiness) - You can use an extra dose of the antihistamine, if needed, for breakthrough symptoms.  - Consider nasal saline rinses 1-2 times daily to remove allergens from the nasal cavities as well as help with mucous clearance (this is especially helpful to do before the nasal sprays are given) - We are going to get blood work to confirm this.   2. Asthma-COPD overlap syndrome - Continue with Dr. . - We are not changing any of your breathing medications at all.   3. Insect sting allergy - We are getting a stinging insect panel to check for stinging insect allergy. - EpiPen training provided. - We will call you in 1-2 weeks with the results of the testing.   4. Return in about 3 months (around 02/23/2021).    This note in its entirety was forwarded to the Provider who requested this consultation.  Subjective:   Eric Diaz is a 75 y.o. male presenting today for evaluation of  Chief Complaint  Patient presents with   Allergic Rhinitis     States he is allergic to honey bees, no Epi. PCP sent him over for sever allergies via blood work.     Eric Diaz has a history of the following: Patient Active Problem List   Diagnosis Date Noted   PNA (pneumonia) 11/08/2020   Current chronic use of systemic steroids - daily prednisone since 2019 11/07/2020   Elevated IgE level 11/07/2020   Frequent falls 11/07/2020   Hyponatremia 11/07/2020   COPD GOLD ?   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  05/04/2018 was referred by Leanne Chang, DO.     Eric Diaz is a 75 y.o. male presenting for an evaluation of allergic and asthma .  He had blood work that showed "severe allergies".   Asthma/Respiratory Symptom History: He has a working diagnosis of COPD. He is on Trelegy one puff once daily. This was started around 18 months ago. It is helping.  He has albuterol to use on a PRN basis. He has not been using it often at all. Dr. 66 has been recommending and pushing for him to come off of as many inhalers as he could. He has been off of his nebulizer. He was recently hospitalized two weeks ago for pneumonia. He has been in the ED due to difficulty breathing. He was in the ED every week in June 2022 for  breathing problems. He was previously seeing another pulmonary doctor.   Allergic Rhinitis Symptom History: He does have a constant runny nose. He does not use anything for it aside from a Kleenex. He has tried an occasional antihistamine, likely Claritin back in the day. He has not been on nose sprays. He was never allergy tested in the past.   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 He was never tested for that. He does not have an EpiPen. He has not been stung since that time aside from a yellow jacket.   He has a history of multiple  falls. He sees a Insurance account manager. He has a referral to a tertiary care center pending. 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  Otherwise, there is no history of other atopic diseases, including food allergies, drug allergies, eczema, urticaria, or contact dermatitis. There is no significant infectious history. Vaccinations are up to date.    Past Medical History: Patient Active Problem List   Diagnosis Date Noted   PNA (pneumonia) 11/08/2020   Current chronic use of systemic steroids - daily prednisone since 2019 11/07/2020   Elevated IgE level 11/07/2020   Frequent falls 11/07/2020   Hyponatremia 11/07/2020   COPD GOLD ?  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 If you have any questions, ask your nurse or doctor.          STOP taking these medications    B-12 PO Stopped by: Alfonse Spruce, MD       TAKE these medications    acetaminophen 500 MG tablet Commonly known as: TYLENOL Take 1,000 mg by mouth 2 (two) times daily.   albuterol 108 (90 Base) MCG/ACT inhaler Commonly known as: VENTOLIN HFA Inhale 1 puff into the lungs every 6 (six) hours as needed for wheezing or shortness of breath. Plz see PCP for additional refills.   Albuterol Sulfate 2.5 MG/0.5ML Nebu Inhale 0.5 mLs (2.5 mg  total) into the lungs 2 (two) times daily as needed (shortness of breath).   aspirin EC 81 MG tablet Take 1 tablet (81 mg total) by mouth daily with breakfast.   atorvastatin 40 MG tablet Commonly known as: LIPITOR Take 40 mg by mouth at bedtime.   bisoprolol 5 MG tablet Commonly known as: ZEBETA Take 1 tablet (5 mg total) by mouth daily.   Carbinoxamine Maleate 4 MG Tabs Take 1 tablet (4 mg total) by mouth every 8 (eight) hours as needed. Started by: Alfonse Spruce, MD   EPINEPHrine 0.3 mg/0.3 mL Soaj injection Commonly known as: EpiPen 2-Pak Inject 0.3 mg into the muscle once for 1 dose. Started by: Alfonse Spruce, MD  feeding supplement Liqd Take 237 mLs by mouth 2 (two) times daily between meals.   multivitamin with minerals Tabs tablet Take 1 tablet by mouth every evening.   nystatin 100000 UNIT/ML suspension Commonly known as: MYCOSTATIN Take 5 mLs by mouth 4 (four) times daily.   pantoprazole 40 MG tablet Commonly known as: PROTONIX Take 1 tablet (40 mg total) by mouth daily. Take 30-60 min before first meal of the day   Trelegy Ellipta 100-62.5-25 MCG/INH Aepb Generic drug: Fluticasone-Umeclidin-Vilant Inhale 1 puff into the lungs daily.   zinc gluconate 50 MG tablet Take 50 mg by mouth every evening.        Birth History: non-contributory  Developmental History: non-contributory  Past Surgical History: Past Surgical History:  Procedure Laterality Date   KNEE ARTHROSCOPY     left   LEAD REVISION/REPAIR N/A 03/25/2018   Procedure: LEAD REVISION/REPAIR;  Surgeon: Duke Salvia, MD;  Location: [facility] INVASIVE CV LAB;  Service: Cardiovascular;  Laterality: N/A;   PACEMAKER IMPLANT N/A 03/06/2018   Procedure: PACEMAKER IMPLANT;  Surgeon: Duke Salvia, MD;  Location: [facility] INVASIVE CV LAB;  Service: Cardiovascular;  Laterality: N/A;     Family History: No family history on file.   Social History: Jonpaul lives at home with his wife.  He has  had a house that is in 44+ years old.  There is hardwood throughout the home.  And electric heating and central cooling.  There.  There are no dust mite covers on the bedding.  There is no current tobacco exposure.  He did smoke for 58 years.  He stopped in June 2022.   Review of Systems  Constitutional: Negative.  Negative for fever, malaise/fatigue and weight loss.  HENT:  Positive for congestion. Negative for ear discharge and ear pain.        Positive for rhinorrhea.   Eyes:  Negative for pain, discharge and redness.  Respiratory:  Negative for cough, sputum production, shortness of breath and wheezing.   Cardiovascular: Negative.  Negative for chest pain and palpitations.  Gastrointestinal:  Negative for heartburn, nausea and vomiting.  Skin: Negative.  Negative for itching and rash.  Neurological:  Negative for dizziness and headaches.  Endo/Heme/Allergies:  Negative for environmental allergies. Does not bruise/bleed easily.      Objective:   Blood pressure 138/80, pulse 91, temperature 98.4 F (36.9 C), temperature source Temporal, resp. rate 14, height 6' (1.829 m), weight 191 lb 6.4 oz (86.8 kg), SpO2 95 %. Body mass index is 25.96 kg/m.   Physical Exam:   Physical Exam Vitals reviewed.  Constitutional:      Appearance: He is well-developed.  HENT:     Head: Normocephalic and atraumatic.     Right Ear: Tympanic membrane, ear canal and external ear normal. No drainage, swelling or tenderness. Tympanic membrane is not injected, scarred, erythematous, retracted or bulging.     Left Ear: Tympanic membrane, ear canal and external ear normal. No drainage, swelling or tenderness. Tympanic membrane is not injected, scarred, erythematous, retracted or bulging.     Nose: No nasal deformity, septal deviation, mucosal edema or rhinorrhea.     Right Turbinates: Enlarged and swollen.     Left Turbinates: Enlarged and swollen.     Right Sinus: No maxillary sinus tenderness or  frontal sinus tenderness.     Left Sinus: No maxillary sinus tenderness or frontal sinus tenderness.     Mouth/Throat:     Mouth: Mucous membranes are not pale and not  dry.     Pharynx: Uvula midline.  Eyes:     General:        Right eye: No discharge.        Left eye: No discharge.     Conjunctiva/sclera: Conjunctivae normal.     Right eye: Right conjunctiva is not injected. No chemosis.    Left eye: Left conjunctiva is not injected. No chemosis.    Pupils: Pupils are equal, round, and reactive to light.  Cardiovascular:     Rate and Rhythm: Normal rate and regular rhythm.     Heart sounds: Normal heart sounds.  Pulmonary:     Effort: Pulmonary effort is normal. No tachypnea, accessory muscle usage or respiratory distress.     Breath sounds: Wheezing present. No rhonchi or rales.     Comments: Coarse upper airway noise in the bilateral lung fields.  Chest:     Chest wall: No tenderness.  Abdominal:     Tenderness: There is no abdominal tenderness. There is no guarding or rebound.  Lymphadenopathy:     Head:     Right side of head: No submandibular, tonsillar or occipital adenopathy.     Left side of head: No submandibular, tonsillar or occipital adenopathy.     Cervical: No cervical adenopathy.  Skin:    General: Skin is warm.     Capillary Refill: Capillary refill takes less than 2 seconds.     Coloration: Skin is not pale.     Findings: No abrasion, erythema, petechiae or rash. Rash is not papular, urticarial or vesicular.     Comments: No eczematous or urticarial lesions noted.   Neurological:     Mental Status: He is alert.  Psychiatric:        Behavior: Behavior is cooperative.     Diagnostic studies:   Allergy Studies:     Airborne Adult Perc - 11/24/20 1442     Time Antigen Placed 1443    Allergen Manufacturer Waynette Buttery    Location Back    Number of Test 59    1. Control-Buffer 50% Glycerol Negative    2. Control-Histamine 1 mg/ml 2+    3. Albumin saline  Negative    4. Bahia Negative    5. French Southern Territories Negative    6. Johnson Negative    7. Kentucky Blue Negative    8. Meadow Fescue Negative    9. Perennial Rye Negative    10. Sweet Vernal Negative    11. Timothy Negative    12. Cocklebur Negative    13. Burweed Marshelder Negative    14. Ragweed, short Negative    15. Ragweed, Giant Negative    16. Plantain,  English Negative    17. Lamb's Quarters Negative    18. Sheep Sorrell Negative    19. Rough Pigweed Negative    20. Marsh Elder, Rough Negative    21. Mugwort, Common Negative    22. Ash mix Negative    23. Birch mix Negative    24. Beech American Negative    25. Box, Elder Negative    26. Cedar, red Negative    27. Cottonwood, Guinea-Bissau Negative    28. Elm mix Negative    29. Hickory Negative    30. Maple mix Negative    31. Oak, Guinea-Bissau mix Negative    32. Pecan Pollen Negative    33. Pine mix Negative    34. Sycamore Eastern Negative    35. Walnut, Black Pollen Negative  36. Alternaria alternata Negative    37. Cladosporium Herbarum Negative    38. Aspergillus mix Negative    39. Penicillium mix Negative    40. Bipolaris sorokiniana (Helminthosporium) Negative    41. Drechslera spicifera (Curvularia) Negative    42. Mucor plumbeus Negative    43. Fusarium moniliforme Negative    44. Aureobasidium pullulans (pullulara) Negative    45. Rhizopus oryzae Negative    46. Botrytis cinera Negative    47. Epicoccum nigrum Negative    48. Phoma betae Negative    49. Candida Albicans Negative    50. Trichophyton mentagrophytes Negative    51. Mite, D Farinae  5,000 AU/ml Negative    52. Mite, D Pteronyssinus  5,000 AU/ml Negative    53. Cat Hair 10,000 BAU/ml Negative    54.  Dog Epithelia Negative    55. Mixed Feathers Negative    56. Horse Epithelia Negative    57. Cockroach, German Negative    58. Mouse Negative    59. Tobacco Leaf Negative             Allergy testing results were read and interpreted by  myself, documented by clinical staff.         Malachi Bonds, MD [facility]

## 2020-11-25 ENCOUNTER — Encounter: Payer: Self-pay | Admitting: Allergy & Immunology

## 2020-11-29 LAB — ALLERGENS W/COMP RFLX AREA 2
Alternaria Alternata IgE: <0.1 kU/L
Aspergillus Fumigatus IgE: <0.1 kU/L
Bermuda Grass IgE: <0.1 kU/L
Cedar, Mountain IgE: <0.1 kU/L
Cladosporium Herbarum IgE: <0.1 kU/L
Cockroach, German IgE: 0.1 kU/L — AB
Common Silver Birch IgE: <0.1 kU/L
Cottonwood IgE: <0.1 kU/L
D Farinae IgE: <0.1 kU/L
D Pteronyssinus IgE: <0.1 kU/L
E001-IgE Cat Dander: 0.26 kU/L — AB
E005-IgE Dog Dander: 0.2 kU/L — AB
Elm, American IgE: <0.1 kU/L
IgE (Immunoglobulin E), Serum: 1046 IU/mL — ABNORMAL HIGH (ref 6–495)
Johnson Grass IgE: <0.1 kU/L
Maple/Box Elder IgE: <0.1 kU/L
Mouse Urine IgE: <0.1 kU/L
Oak, White IgE: <0.1 kU/L
Pecan, Hickory IgE: <0.1 kU/L
Penicillium Chrysogen IgE: <0.1 kU/L
Pigweed, Rough IgE: <0.1 kU/L
Ragweed, Short IgE: <0.1 kU/L
Sheep Sorrel IgE Qn: <0.1 kU/L
Timothy Grass IgE: <0.1 kU/L
White Mulberry IgE: <0.1 kU/L

## 2020-11-29 LAB — ALLERGEN STINGING INSECT PANEL
Honeybee IgE: <0.1 kU/L
Hornet, White Face, IgE: 2.71 kU/L — AB
Hornet, Yellow, IgE: 2.55 kU/L — AB
Paper Wasp IgE: 5.87 kU/L — AB
Yellow Jacket, IgE: 1.47 kU/L — AB

## 2020-11-29 LAB — TRYPTASE: Tryptase: 4.7 ug/L (ref 2.2–13.2)

## 2020-12-17 ENCOUNTER — Telehealth: Payer: Self-pay | Admitting: Internal Medicine

## 2020-12-20 NOTE — Telephone Encounter (Signed)
ATC patient to let him know that we do not have any trelegy 100 samples in the [place] office at the moment but are waiting on some to come in and once we get some in I will call him and have some ready for him. Did not get an answer and VM was full.

## 2020-12-21 ENCOUNTER — Other Ambulatory Visit: Payer: Self-pay

## 2020-12-21 MED ORDER — TRELEGY ELLIPTA 100-62.5-25 MCG/ACT IN AEPB
1.0000 | INHALATION_SPRAY | Freq: Every day | RESPIRATORY_TRACT | 0 refills | Status: DC
Start: 2020-12-21 — End: 2021-02-07

## 2020-12-21 NOTE — Telephone Encounter (Signed)
ATC patient to let him know we have samples in stock as of this afternoon. No VM set up.

## 2020-12-22 NOTE — Telephone Encounter (Signed)
Samples picked up by pts wife yesterday 12/21/2020. Nothing further needed at this time.

## 2020-12-27 ENCOUNTER — Ambulatory Visit (INDEPENDENT_AMBULATORY_CARE_PROVIDER_SITE_OTHER): Payer: Medicare HMO

## 2020-12-27 DIAGNOSIS — I442 Atrioventricular block, complete: Secondary | ICD-10-CM

## 2020-12-28 LAB — CUP PACEART REMOTE DEVICE CHECK
Battery Remaining Longevity: 90 mo
Battery Remaining Percentage: 72 %
Battery Voltage: 3.01 V
Brady Statistic AP VP Percent: 1 %
Brady Statistic AP VS Percent: 1 %
Brady Statistic AS VP Percent: 61 %
Brady Statistic AS VS Percent: 39 %
Brady Statistic RA Percent Paced: 1 %
Brady Statistic RV Percent Paced: 61 %
Date Time Interrogation Session: 20221101003531
Implantable Lead Implant Date: 20200108
Implantable Lead Implant Date: 20200108
Implantable Lead Location: 753859
Implantable Lead Location: 753860
Implantable Lead Model: 5076
Implantable Lead Model: 5076
Implantable Pulse Generator Implant Date: 20200108
Lead Channel Impedance Value: 360 Ohm
Lead Channel Impedance Value: 700 Ohm
Lead Channel Pacing Threshold Amplitude: 0.5 V
Lead Channel Pacing Threshold Amplitude: 0.75 V
Lead Channel Pacing Threshold Pulse Width: 0.4 ms
Lead Channel Pacing Threshold Pulse Width: 0.5 ms
Lead Channel Sensing Intrinsic Amplitude: 5 mV
Lead Channel Sensing Intrinsic Amplitude: 6.1 mV
Lead Channel Setting Pacing Amplitude: 0.75 V
Lead Channel Setting Pacing Amplitude: 2 V
Lead Channel Setting Pacing Pulse Width: 0.4 ms
Lead Channel Setting Sensing Sensitivity: 3 mV
Pulse Gen Model: 2272
Pulse Gen Serial Number: 9091999

## 2021-01-03 NOTE — Progress Notes (Signed)
Remote pacemaker transmission.   

## 2021-02-07 ENCOUNTER — Ambulatory Visit (HOSPITAL_COMMUNITY)
Admission: RE | Admit: 2021-02-07 | Discharge: 2021-02-07 | Disposition: A | Discharge status: Home / Self Care | Payer: Medicare HMO | Source: Ambulatory Visit | Attending: Internal Medicine | Admitting: Internal Medicine

## 2021-02-07 ENCOUNTER — Other Ambulatory Visit: Payer: Self-pay

## 2021-02-07 ENCOUNTER — Encounter: Payer: Self-pay | Admitting: Internal Medicine

## 2021-02-07 ENCOUNTER — Ambulatory Visit: Payer: Medicare HMO | Admitting: Internal Medicine

## 2021-02-07 DIAGNOSIS — J449 Chronic obstructive pulmonary disease, unspecified: Secondary | ICD-10-CM | POA: Diagnosis not present

## 2021-02-07 MED ORDER — TRELEGY ELLIPTA 100-62.5-25 MCG/ACT IN AEPB: 1.0000 | INHALATION_SPRAY | Freq: Every day | RESPIRATORY_TRACT | 0 refills | Status: DC

## 2021-02-07 NOTE — Progress Notes (Addendum)
Eric Diaz, male    DOB: 02-20-1946      MRN: [id number]   Brief patient profile:  69 yowm MM/quit smoking 07/2020 onset around 2018 on prn saba then trelegy started around 2019 and steroid dep at 10 mg /day with 3 flares since May 2022 while in [place] referred to pulmonary clinic in [place]  09/10/2020 by EDP      History of Present Illness  09/10/2020  Pulmonary/ 1st office eval/ Melvyn Novas / [place] [facility]  Chief Complaint  Patient presents with   Pulmonary Consult    Referred by [facility]- pt with COPD and frequent flare ups since June 2022.  He states wheezing some today.   Dyspnea:  worse just today finished pred 4 days prior to OV   Cough: better slt yellowish  Sleep: better p last ER / flat bed with pillow SABA use: neb 4 h prior to OV  02 none  Rec I recommend you get off the coreg  and the substitute is called bisoprolol 5 mg one daily  Plan A = Automatic = Always=    Trelegy [id number] one click  first thing each am  Prednisone 10 mg daily - take 2 daily if not doing great  Pantoprazole (protonix) 40 mg   Take  30-60 min before first meal of the day and Pepcid (famotidine)  20 mg after supper until return to office - this is the best way to tell whether stomach acid is contributing to your problem.   Plan B = Backup (to supplement plan A, not to replace it) Only use your albuterol inhaler as a rescue medication Plan C = Crisis (instead of Plan B but only if Plan B stops working) - only use your albuterol nebulizer if you first try Plan B and it fails to help > ok to use the nebulizer up to every 4 hours but if start needing it regularly call for immediate appointment GERD diet reviewed, bed blocks rec  Please schedule a follow up office visit in 4 weeks, sooner if needed  with all medications /inhalers/ solutions in hand so we can verify exactly what you are taking. This includes all medications from all doctors and over the counters    10/06/2020  f/u ov/[place] office/Bailey Kolbe re:  atopic / MM/ maint on trelegy  / pred 20 mg one half daily  Chief Complaint  Patient presents with   Follow-up    Breathing overall doing well- not as well as it was before he had seizure approx 1 wk ago. He has used his albuterol inhaler once in the past wk and it helped.    Dyspnea:  extremely sedentary / walks with cane due to balance  Cough: minimal  Sleeping: 45 degrees recliner due to back  SABA use: as above  02: none  Covid status: x 3  Lung cancer screening: advised  Rec Allergy eval > neg skin testing 11/24/20    02/07/2021  f/u ov/[place] office/Bazil Dhanani re: ?ACOS  maint on trelegy / prednisone 10 mg one half  daily  Chief Complaint  Patient presents with   Follow-up    Breathing and wheezing have improved since last OV.   [place] stay for pneumonia in September 2022.    Dyspnea:  more mobile with rollator/ limited by balance not sob  Cough: minimal  Sleeping: now able to lie flat one pillow SABA use: rare hfa  rare neb  02: none  - sats run 92% walking when he checks them  Lung cancer screening: due 10/2018   No obvious day to day or daytime variability or assoc excess/ purulent sputum or mucus plugs or hemoptysis or cp or chest tightness, subjective wheeze or overt sinus or hb symptoms.   Sleeping  without nocturnal  or early am exacerbation  of respiratory  c/o's or need for noct saba. Also denies any obvious fluctuation of symptoms with weather or environmental changes or other aggravating or alleviating factors except as outlined above   No unusual exposure hx or h/o childhood pna/ asthma or knowledge of premature birth.  Current Allergies, Complete Past Medical History, Past Surgical History, Family History, and Social History were reviewed in [name].  ROS  The following are not active complaints unless bolded Hoarseness, sore throat, dysphagia, dental problems, itching, sneezing,  nasal congestion or discharge of excess mucus or  purulent secretions, ear ache,   fever, chills, sweats, unintended wt loss or wt gain, classically pleuritic or exertional cp,  orthopnea pnd or arm/hand swelling  or leg swelling, presyncope, palpitations, abdominal pain, anorexia, nausea, vomiting, diarrhea  or change in bowel habits or change in bladder habits, change in stools or change in urine, dysuria, hematuria,  rash, arthralgias, visual complaints, headache, numbness, weakness or ataxia or problems with walking or coordination,  change in mood or  memory.        Current Meds  Medication Sig   acetaminophen (TYLENOL) 500 MG tablet Take 1,000 mg by mouth 2 (two) times daily.   albuterol (VENTOLIN HFA) 108 (90 Base) MCG/ACT inhaler Inhale 1 puff into the lungs every 6 (six) hours as needed for wheezing or shortness of breath. Plz see PCP for additional refills.   Albuterol Sulfate 2.5 MG/0.5ML NEBU Inhale 0.5 mLs (2.5 mg total) into the lungs 2 (two) times daily as needed (shortness of breath).   Ascorbic Acid (VITAMIN C) 1000 MG tablet Take 1,000 mg by mouth daily.   aspirin EC 81 MG tablet Take 1 tablet (81 mg total) by mouth daily with breakfast.   atorvastatin (LIPITOR) 40 MG tablet Take 40 mg by mouth at bedtime.   bisoprolol (ZEBETA) 5 MG tablet Take 1 tablet (5 mg total) by mouth daily.   Cholecalciferol (VITAMIN D3) 25 MCG (1000 UT) CAPS Take by mouth.   Fluticasone-Umeclidin-Vilant (TRELEGY ELLIPTA) 100-62.5-25 MCG/INH AEPB Inhale 1 puff into the lungs daily.   Multiple Vitamin (MULTIVITAMIN WITH MINERALS) TABS tablet Take 1 tablet by mouth every evening.   pantoprazole (PROTONIX) 40 MG tablet Take 1 tablet (40 mg total) by mouth daily. Take 30-60 min before first meal of the day   predniSONE (DELTASONE) 10 MG tablet Take 10 mg by mouth daily with breakfast.   zinc gluconate 50 MG tablet Take 50 mg by mouth every evening.   [DISCONTINUED] Fluticasone-Umeclidin-Vilant (TRELEGY ELLIPTA) 100-62.5-25 MCG/ACT AEPB Inhale 1 puff into the  lungs daily.         Past Medical History:  Diagnosis Date   COPD (chronic obstructive pulmonary disease) (HCC)    High cholesterol    Hypertension     Objective:      02/07/2021     180  10/06/20 194 lb (88 kg)  09/10/20 187 lb (84.8 kg)  08/22/20 192 lb (87.1 kg)    Vital signs reviewed  02/07/2021  - Note at rest 02 sats  97% on RA   General appearance:    rollator / chronically ill appearing   HEENT : pt wearing mask not removed  for exam due to covid -19 concerns.    NECK :  without JVD/Nodes/TM/ nl carotid upstrokes bilaterally   LUNGS: no acc muscle use,  Mod barrel  contour chest wall with bilateral  Distant bs s audible wheeze and  without cough on insp or exp maneuvers and mod  Hyperresonant  to  percussion bilaterally     CV:  RRR  no s3 or murmur or increase in P2, and no edema   ABD:  soft and nontender with pos mid insp Hoover's  in the supine position. No bruits or organomegaly appreciated, bowel sounds nl  MS:     ext warm without deformities, calf tenderness, cyanosis or clubbing No obvious joint restrictions   SKIN: warm and dry without lesions    NEURO:  alert, approp, nl sensorium with  no motor or cerebellar deficits apparent.          CXR PA and Lateral:   02/07/2021 :    I personally reviewed images and agree with radiology impression as follows:     No active cardiopulmonary disease.   Assessment

## 2021-02-07 NOTE — Assessment & Plan Note (Addendum)
Quit smoking 07/2020  - Labs ordered 09/10/2020  :     alpha one AT phenotype  MM   Level 150  - Allergy profile  09/10/20  >  Eos 0.0 /  IgE  1348 > refer to allergy 09/15/2020 >>> neg skin testing 11/24/20    Group D in terms of symptom/risk and laba/lama/ICS  therefore appropriate rx at this point >>>  Trelegy/ physiologic near  doses of prednisone (he says x 4 years)   and prn saba which he is now using sparingly and able to lie flat noct s cough/ wheeze or need for saba   No change in Rx needed / f/u in 6 m, sooner if needed with pfts req from [facility] pending and probably won't need to be repeated here.          Each maintenance medication was reviewed in detail including emphasizing most importantly the difference between maintenance and prns and under what circumstances the prns are to be triggered using an action plan format where appropriate.  Total time for H and P, chart review, counseling, reviewing dip, hfa/neb  device(s) and generating customized AVS unique to this office visit / same day charting = 25 min

## 2021-02-07 NOTE — Patient Instructions (Addendum)
We need a set of your PFT's from the last few years from [facility] pulmonary   Please remember to go to the  x-ray department  @  [facility] for your tests - we will call you with the results when they are available   No change in medications    Please schedule a follow up visit in 6 months  - call sooner if needed

## 2021-03-28 ENCOUNTER — Ambulatory Visit (INDEPENDENT_AMBULATORY_CARE_PROVIDER_SITE_OTHER): Payer: Medicare HMO

## 2021-03-28 DIAGNOSIS — I442 Atrioventricular block, complete: Secondary | ICD-10-CM | POA: Diagnosis not present

## 2021-03-28 LAB — CUP PACEART REMOTE DEVICE CHECK
Battery Remaining Longevity: 87 mo
Battery Remaining Percentage: 70 %
Battery Voltage: 3.01 V
Brady Statistic AP VP Percent: 1 %
Brady Statistic AP VS Percent: 1 %
Brady Statistic AS VP Percent: 71 %
Brady Statistic AS VS Percent: 28 %
Brady Statistic RA Percent Paced: 1 %
Brady Statistic RV Percent Paced: 71 %
Date Time Interrogation Session: 20230130020014
Implantable Lead Implant Date: 20200108
Implantable Lead Implant Date: 20200108
Implantable Lead Location: 753859
Implantable Lead Location: 753860
Implantable Lead Model: 5076
Implantable Lead Model: 5076
Implantable Pulse Generator Implant Date: 20200108
Lead Channel Impedance Value: 360 Ohm
Lead Channel Impedance Value: 730 Ohm
Lead Channel Pacing Threshold Amplitude: 0.5 V
Lead Channel Pacing Threshold Amplitude: 0.75 V
Lead Channel Pacing Threshold Pulse Width: 0.4 ms
Lead Channel Pacing Threshold Pulse Width: 0.5 ms
Lead Channel Sensing Intrinsic Amplitude: 4.4 mV
Lead Channel Sensing Intrinsic Amplitude: 6.4 mV
Lead Channel Setting Pacing Amplitude: 0.75 V
Lead Channel Setting Pacing Amplitude: 2 V
Lead Channel Setting Pacing Pulse Width: 0.4 ms
Lead Channel Setting Sensing Sensitivity: 3 mV
Pulse Gen Model: 2272
Pulse Gen Serial Number: 9091999

## 2021-04-05 NOTE — Progress Notes (Signed)
Remote pacemaker transmission.   

## 2021-06-27 ENCOUNTER — Ambulatory Visit (INDEPENDENT_AMBULATORY_CARE_PROVIDER_SITE_OTHER): Payer: Medicare HMO

## 2021-06-27 DIAGNOSIS — I442 Atrioventricular block, complete: Secondary | ICD-10-CM | POA: Diagnosis not present

## 2021-06-27 LAB — CUP PACEART REMOTE DEVICE CHECK
Battery Remaining Longevity: 84 mo
Battery Remaining Percentage: 67 %
Battery Voltage: 3.01 V
Brady Statistic AP VP Percent: 1 %
Brady Statistic AP VS Percent: 1 %
Brady Statistic AS VP Percent: 77 %
Brady Statistic AS VS Percent: 23 %
Brady Statistic RA Percent Paced: 1 %
Brady Statistic RV Percent Paced: 77 %
Date Time Interrogation Session: 20230501020013
Implantable Lead Implant Date: 20200108
Implantable Lead Implant Date: 20200108
Implantable Lead Location: 753859
Implantable Lead Location: 753860
Implantable Lead Model: 5076
Implantable Lead Model: 5076
Implantable Pulse Generator Implant Date: 20200108
Lead Channel Impedance Value: 360 Ohm
Lead Channel Impedance Value: 750 Ohm
Lead Channel Pacing Threshold Amplitude: 0.375 V
Lead Channel Pacing Threshold Amplitude: 0.75 V
Lead Channel Pacing Threshold Pulse Width: 0.4 ms
Lead Channel Pacing Threshold Pulse Width: 0.5 ms
Lead Channel Sensing Intrinsic Amplitude: 4.4 mV
Lead Channel Sensing Intrinsic Amplitude: 9.4 mV
Lead Channel Setting Pacing Amplitude: 0.625
Lead Channel Setting Pacing Amplitude: 2 V
Lead Channel Setting Pacing Pulse Width: 0.4 ms
Lead Channel Setting Sensing Sensitivity: 3 mV
Pulse Gen Model: 2272
Pulse Gen Serial Number: 9091999

## 2021-07-11 NOTE — Progress Notes (Signed)
Remote pacemaker transmission.   

## 2021-08-05 ENCOUNTER — Other Ambulatory Visit: Payer: Self-pay | Admitting: Internal Medicine

## 2021-08-17 ENCOUNTER — Telehealth: Payer: Self-pay | Admitting: Internal Medicine

## 2021-08-17 MED ORDER — TRELEGY ELLIPTA 100-62.5-25 MCG/ACT IN AEPB: 1.0000 | INHALATION_SPRAY | Freq: Every day | RESPIRATORY_TRACT | 0 refills | Status: DC

## 2021-08-17 NOTE — Telephone Encounter (Signed)
Called and spoke to patients wife and let her know we can have samples here for pick up at front in [facility] office. She voiced understanding and states she will come around 2pm to pick up. Also asked patients wife if they have filled out [employer] assistance paperwork for trelegy and she states they have not but they are interested in trying. Will leave paperwork in bag with samples. Instructed her to fill out and bring back once filled out and we will take care of the rest and fax to [employer]. She voiced understanding. Samples and paperwork left at front. Nothing further needed  at this time.

## 2021-09-06 DIAGNOSIS — G8929 Other chronic pain: Secondary | ICD-10-CM | POA: Insufficient documentation

## 2021-09-06 DIAGNOSIS — H409 Unspecified glaucoma: Secondary | ICD-10-CM | POA: Insufficient documentation

## 2021-09-26 ENCOUNTER — Ambulatory Visit (INDEPENDENT_AMBULATORY_CARE_PROVIDER_SITE_OTHER): Payer: Medicare HMO

## 2021-09-26 DIAGNOSIS — I442 Atrioventricular block, complete: Secondary | ICD-10-CM | POA: Diagnosis not present

## 2021-09-26 LAB — CUP PACEART REMOTE DEVICE CHECK
Battery Remaining Longevity: 81 mo
Battery Remaining Percentage: 65 %
Battery Voltage: 3.01 V
Brady Statistic AP VP Percent: 1 %
Brady Statistic AP VS Percent: 1 %
Brady Statistic AS VP Percent: 81 %
Brady Statistic AS VS Percent: 19 %
Brady Statistic RA Percent Paced: 1 %
Brady Statistic RV Percent Paced: 81 %
Date Time Interrogation Session: 20230731020012
Implantable Lead Implant Date: 20200108
Implantable Lead Implant Date: 20200108
Implantable Lead Location: 753859
Implantable Lead Location: 753860
Implantable Lead Model: 5076
Implantable Lead Model: 5076
Implantable Pulse Generator Implant Date: 20200108
Lead Channel Impedance Value: 360 Ohm
Lead Channel Impedance Value: 750 Ohm
Lead Channel Pacing Threshold Amplitude: 0.5 V
Lead Channel Pacing Threshold Amplitude: 0.75 V
Lead Channel Pacing Threshold Pulse Width: 0.4 ms
Lead Channel Pacing Threshold Pulse Width: 0.5 ms
Lead Channel Sensing Intrinsic Amplitude: 4.2 mV
Lead Channel Sensing Intrinsic Amplitude: 5.8 mV
Lead Channel Setting Pacing Amplitude: 0.75 V
Lead Channel Setting Pacing Amplitude: 2 V
Lead Channel Setting Pacing Pulse Width: 0.4 ms
Lead Channel Setting Sensing Sensitivity: 3 mV
Pulse Gen Model: 2272
Pulse Gen Serial Number: 9091999

## 2021-09-29 ENCOUNTER — Encounter: Payer: Self-pay | Admitting: Internal Medicine

## 2021-09-29 ENCOUNTER — Other Ambulatory Visit: Payer: Self-pay

## 2021-09-29 ENCOUNTER — Ambulatory Visit: Payer: Medicare HMO | Admitting: Internal Medicine

## 2021-09-29 DIAGNOSIS — J449 Chronic obstructive pulmonary disease, unspecified: Secondary | ICD-10-CM

## 2021-09-29 DIAGNOSIS — F17210 Nicotine dependence, cigarettes, uncomplicated: Secondary | ICD-10-CM

## 2021-09-29 MED ORDER — ALBUTEROL SULFATE HFA 108 (90 BASE) MCG/ACT IN AERS: 1.0000 | INHALATION_SPRAY | Freq: Four times a day (QID) | RESPIRATORY_TRACT | 11 refills | Status: AC | PRN

## 2021-09-29 MED ORDER — TRELEGY ELLIPTA 100-62.5-25 MCG/ACT IN AEPB: 1.0000 | INHALATION_SPRAY | Freq: Every day | RESPIRATORY_TRACT | 11 refills | Status: DC

## 2021-09-29 NOTE — Assessment & Plan Note (Signed)
Quit smoking 07/2020  - PFT's  11/17/19 FEV1 1.15 (39 % ) ratio 0.45  p 2  % improvement from saba p ? prior to study with DLCO  8.7 (43%) corrects to 2.11 (88%)  for alv volume and FV curve classic concavity   - Labs ordered 09/10/2020  :     alpha one AT phenotype  MM   Level 150  - Allergy profile  09/10/20  >  Eos 0.0 /  IgE  1348 > refer to allergy  09/15/2020  rec antihistamines and f/u 3 m  >>> neg skin testing 11/24/20 - 09/29/2021   Walked on RA  x  2  lap(s) =  approx 300  ft  @ mod/cane pace, stopped due to legs tired  with lowest 02 sats 91%   Group D (now reclassified as E) in terms of symptom/risk and laba/lama/ICS  therefore appropriate rx at this point >>>  trelegy and approp saba plus pred floor 5 mg daily x 3 y   No further options at this point / no allergy f/u planned

## 2021-09-29 NOTE — Patient Instructions (Addendum)
For nasal symptoms see Dr Dellis Anes  My office will be contacting you by phone for referral for the lung cancer screening program  - if you don't hear back from my office within one week please call us back or notify us thru MyChart and we'll address it right away.   Ok to try albuterol 15 min before an activity (on alternating days)  that you know would usually make you short of breath and see if it makes any difference and if makes none then don't take albuterol after activity unless you can't catch your breath as this means it's the resting that helps, not the albuterol.

## 2021-09-29 NOTE — Progress Notes (Signed)
Eric Diaz, male    DOB: 1945-06-14      MRN: [id number]   Brief patient profile:  89 yowm MM/ active smoker onset sob around 2018 on prn saba then trelegy started around 2019 and steroid dep at 10 mg /day with 3 flares since May 2022 while in [place] referred to pulmonary clinic in [place]  09/10/2020 by EDP      History of Present Illness  09/10/2020  Pulmonary/ 1st office eval/ [name] / [place] Office  Chief Complaint  Patient presents with   Pulmonary Consult    Referred by [facility]- pt with COPD and frequent flare ups since June 2022.  He states wheezing some today.   Dyspnea:  worse just today finished pred 4 days prior to OV   Cough: better slt yellowish  Sleep: better p last ER / flat bed with pillow SABA use: neb 4 h prior to OV  02 none  Rec I recommend you get off the coreg  and the substitute is called bisoprolol 5 mg one daily  Plan A = Automatic = Always=    Trelegy 100 one click  first thing each am  Prednisone 10 mg daily - take 2 daily if not doing great  Pantoprazole (protonix) 40 mg   Take  30-60 min before first meal of the day and Pepcid (famotidine)  20 mg after supper until return to office - this is the best way to tell whether stomach acid is contributing to your problem.   Plan B = Backup (to supplement plan A, not to replace it) Only use your albuterol inhaler as a rescue medication Plan C = Crisis (instead of Plan B but only if Plan B stops working) - only use your albuterol nebulizer if you first try Plan B and it fails to help > ok to use the nebulizer up to every 4 hours but if start needing it regularly call for immediate appointment GERD diet reviewed, bed blocks rec  Please schedule a follow up office visit in 4 weeks, sooner if needed  with all medications /inhalers/ solutions in hand so we can verify exactly what you are taking. This includes all medications from all doctors and over the counters    10/06/2020  f/u ov/[place] office/[name] re:  atopic / MM/ maint on trelegy  / pred 20 mg one half daily  Chief Complaint  Patient presents with   Follow-up    Breathing overall doing well- not as well as it was before he had seizure approx 1 wk ago. He has used his albuterol inhaler once in the past wk and it helped.    Dyspnea:  extremely sedentary / walks with cane due to balance  Cough: minimal  Sleeping: 45 degrees recliner due to back  SABA use: as above  02: none  Covid status: x 3  Lung cancer screening: advised  Rec Allergy eval > neg skin testing 11/24/20    02/07/2021  f/u ov/[place] office/[name] re: ?ACOS  maint on trelegy / prednisone 10 mg one half  daily  Chief Complaint  Patient presents with   Follow-up    Breathing and wheezing have improved since last OV.   Hosp stay for pneumonia in September 2022.    Dyspnea:  more mobile with rollator/ limited by balance not sob  Cough: minimal  Sleeping: now able to lie flat one pillow SABA use: rare hfa  rare neb  02: none  - sats run 92% walking when he checks  them  Lung cancer screening: due 10/2018 Rec  No change in medications      09/29/2021  f/u ov/[place] office/[name] re: GOLD 3  maint on Trelegy and pred 20 mg daily  with lowest 5 mg x 3 y Chief Complaint  Patient presents with   Follow-up    Doing well since last ov   Dyspnea:  500 ft to shed flat / did not need to stop/ also limited by back/leg pains  Cough: minimal cough Sleeping: flat bed / one pillow  SABA use: alb hfa every 2-3 days  02: none  Covid status:  vax x 3  Lung cancer screening: advised for Sept 2023    No obvious day to day or daytime variability or assoc excess/ purulent sputum or mucus plugs or hemoptysis or cp or chest tightness, subjective wheeze or overt sinus or hb symptoms.   Sleeping ok as above  without nocturnal  or early am exacerbation  of respiratory  c/o's or need for noct saba. Also denies any obvious fluctuation of symptoms with weather or environmental changes or  other aggravating or alleviating factors except as outlined above   No unusual exposure hx or h/o childhood pna/ asthma or knowledge of premature birth.  Current Allergies, Complete Past Medical History, Past Surgical History, Family History, and Social History were reviewed in [name].  ROS  The following are not active complaints unless bolded Hoarseness, sore throat, dysphagia, dental problems, itching, sneezing,  nasal congestion or discharge of excess watery  mucus or purulent secretions, ear ache,   fever, chills, sweats, unintended wt loss or wt gain, classically pleuritic or exertional cp,  orthopnea pnd or arm/hand swelling  or leg swelling, presyncope, palpitations, abdominal pain, anorexia, nausea, vomiting, diarrhea  or change in bowel habits or change in bladder habits, change in stools or change in urine, dysuria, hematuria,  rash, arthralgias, visual complaints, headache, numbness, weakness or ataxia or problems with walking or coordination,  change in mood or  memory.        Current Meds  Medication Sig   acetaminophen (TYLENOL) 500 MG tablet Take 1,000 mg by mouth 2 (two) times daily.   albuterol (VENTOLIN HFA) 108 (90 Base) MCG/ACT inhaler Inhale 1 puff into the lungs every 6 (six) hours as needed for wheezing or shortness of breath. Plz see PCP for additional refills.   Albuterol Sulfate 2.5 MG/0.5ML NEBU Inhale 0.5 mLs (2.5 mg total) into the lungs 2 (two) times daily as needed (shortness of breath).   Ascorbic Acid (VITAMIN C) 1000 MG tablet Take 1,000 mg by mouth daily.   aspirin EC 81 MG tablet Take 1 tablet (81 mg total) by mouth daily with breakfast.   atorvastatin (LIPITOR) 40 MG tablet Take 40 mg by mouth at bedtime.   bisoprolol (ZEBETA) 5 MG tablet TAKE 1 TABLET (5 MG TOTAL) BY MOUTH DAILY.   Cholecalciferol (VITAMIN D3) 25 MCG (1000 UT) CAPS Take by mouth.   Fluticasone-Umeclidin-Vilant (TRELEGY ELLIPTA) 100-62.5-25 MCG/ACT AEPB  Inhale 1 puff into the lungs daily.   Multiple Vitamin (MULTIVITAMIN WITH MINERALS) TABS tablet Take 1 tablet by mouth every evening.   pantoprazole (PROTONIX) 40 MG tablet Take 1 tablet (40 mg total) by mouth daily. Take 30-60 min before first meal of the day   predniSONE (DELTASONE) 10 MG tablet Take 10 mg by mouth daily with breakfast.   zinc gluconate 50 MG tablet Take 50 mg by mouth every evening.  Past Medical History:  Diagnosis Date   COPD (chronic obstructive pulmonary disease) (HCC)    High cholesterol    Hypertension     Objective:    Wts  09/29/2021         186  02/07/2021     180  10/06/20 194 lb (88 kg)  09/10/20 187 lb (84.8 kg)  08/22/20 192 lb (87.1 kg)     Vital signs reviewed  09/29/2021  - Note at rest 02 sats  97% on RA   General appearance:    obese amb wm nad/ min buffalo hump deformity   HEENT :  Oropharynx  upper denture, lower partial / thrush  Nasal turbinates nl    NECK :  without JVD/Nodes/TM/ nl carotid upstrokes bilaterally   LUNGS: no acc muscle use,  Mod barrel  contour chest wall with bilateral  Distant bs s audible wheeze and  without cough on insp or exp maneuvers and mod  Hyperresonant  to  percussion bilaterally     CV:  RRR  no s3 or murmur or increase in P2, and no edema   ABD: Obese  soft and nontender with pos mid insp Hoover's  in the supine position. No bruits or organomegaly appreciated, bowel sounds nl  MS:   Ext warm without deformities or   obvious joint restrictions , calf tenderness, cyanosis or clubbing  SKIN: warm and dry without lesions    NEURO:  alert, approp, nl sensorium with  no motor or cerebellar deficits apparent.              Assessment

## 2021-09-29 NOTE — Assessment & Plan Note (Addendum)
Referred for annual lung cancer screeing due q September 09/29/2021 >>>    Discussed in detail all the  indications, usual  risks and alternatives  relative to the benefits with patient who agrees to proceed with w/u as outlined.   Each maintenance medication was reviewed in detail including emphasizing most importantly the difference between maintenance and prns and under what circumstances the prns are to be triggered using an action plan format where appropriate.  Total time for H and P, chart review, counseling, reviewing hfa/dpi device(s) , directly observing portions of ambulatory 02 saturation study/ and generating customized AVS unique to this office visit / same day charting  > 30 min for multiple  refractory respiratory  symptoms of uncertain etiology

## 2021-09-30 ENCOUNTER — Other Ambulatory Visit: Payer: Self-pay | Admitting: *Deleted

## 2021-09-30 MED ORDER — PANTOPRAZOLE SODIUM 40 MG PO TBEC: 40.0000 mg | DELAYED_RELEASE_TABLET | Freq: Every day | ORAL | 3 refills | Status: DC

## 2021-10-21 ENCOUNTER — Other Ambulatory Visit: Payer: Self-pay | Admitting: *Deleted

## 2021-10-21 MED ORDER — TRELEGY ELLIPTA 100-62.5-25 MCG/ACT IN AEPB: 1.0000 | INHALATION_SPRAY | Freq: Every day | RESPIRATORY_TRACT | 11 refills | Status: DC

## 2021-10-30 NOTE — Progress Notes (Signed)
Remote pacemaker transmission.   

## 2021-12-26 ENCOUNTER — Ambulatory Visit (INDEPENDENT_AMBULATORY_CARE_PROVIDER_SITE_OTHER): Payer: Medicare HMO

## 2021-12-26 DIAGNOSIS — I442 Atrioventricular block, complete: Secondary | ICD-10-CM | POA: Diagnosis not present

## 2021-12-26 DIAGNOSIS — Z950 Presence of cardiac pacemaker: Secondary | ICD-10-CM | POA: Diagnosis not present

## 2021-12-27 LAB — CUP PACEART REMOTE DEVICE CHECK
Battery Remaining Longevity: 79 mo
Battery Remaining Percentage: 62 %
Battery Voltage: 3.01 V
Brady Statistic AP VP Percent: 1 %
Brady Statistic AP VS Percent: 1 %
Brady Statistic AS VP Percent: 83 %
Brady Statistic AS VS Percent: 16 %
Brady Statistic RA Percent Paced: 1 %
Brady Statistic RV Percent Paced: 83 %
Date Time Interrogation Session: 20231030020013
Implantable Lead Connection Status: 753985
Implantable Lead Connection Status: 753985
Implantable Lead Implant Date: 20200108
Implantable Lead Implant Date: 20200108
Implantable Lead Location: 753859
Implantable Lead Location: 753860
Implantable Lead Model: 5076
Implantable Lead Model: 5076
Implantable Pulse Generator Implant Date: 20200108
Lead Channel Impedance Value: 380 Ohm
Lead Channel Impedance Value: 760 Ohm
Lead Channel Pacing Threshold Amplitude: 0.5 V
Lead Channel Pacing Threshold Amplitude: 0.75 V
Lead Channel Pacing Threshold Pulse Width: 0.4 ms
Lead Channel Pacing Threshold Pulse Width: 0.5 ms
Lead Channel Sensing Intrinsic Amplitude: 3.6 mV
Lead Channel Sensing Intrinsic Amplitude: 8.2 mV
Lead Channel Setting Pacing Amplitude: 0.75 V
Lead Channel Setting Pacing Amplitude: 2 V
Lead Channel Setting Pacing Pulse Width: 0.4 ms
Lead Channel Setting Sensing Sensitivity: 3 mV
Pulse Gen Model: 2272
Pulse Gen Serial Number: 9091999

## 2022-01-28 NOTE — Progress Notes (Signed)
Remote pacemaker transmission.   

## 2022-02-08 ENCOUNTER — Encounter: Payer: Self-pay | Admitting: Internal Medicine

## 2022-02-08 ENCOUNTER — Ambulatory Visit: Payer: Medicare HMO | Attending: Internal Medicine | Admitting: Internal Medicine

## 2022-02-08 VITALS — BP 138/78 | HR 92 | Ht 72.0 in | Wt 181.0 lb

## 2022-02-08 DIAGNOSIS — I442 Atrioventricular block, complete: Secondary | ICD-10-CM | POA: Diagnosis not present

## 2022-02-08 DIAGNOSIS — Z79899 Other long term (current) drug therapy: Secondary | ICD-10-CM

## 2022-02-08 DIAGNOSIS — Z950 Presence of cardiac pacemaker: Secondary | ICD-10-CM

## 2022-02-08 LAB — CBC
Hematocrit: 43.5 % (ref 37.5–51.0)
Hemoglobin: 14.6 g/dL (ref 13.0–17.7)
MCH: 33 pg (ref 26.6–33.0)
MCHC: 33.6 g/dL (ref 31.5–35.7)
MCV: 98 fL — ABNORMAL HIGH (ref 79–97)
Platelets: 255 10*3/uL (ref 150–450)
RBC: 4.43 x10E6/uL (ref 4.14–5.80)
RDW: 12.8 % (ref 11.6–15.4)
WBC: 15.2 10*3/uL — ABNORMAL HIGH (ref 3.4–10.8)

## 2022-02-08 NOTE — Progress Notes (Signed)
Patient Care Team: Vanessa Cooke, FNP as PCP - General (Family Medicine)   HPI  Eric Diaz is a 76 y.o. 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  He developed high ventricular pacing thresholds. Underwent lead revision 2 weeks later  History of oxygen dependent COPD; seen by Dr. [name] 8/23 who recommended discontinuation of his carvedilol and institution of bisoprolol as a selective  No complaints of dyspnea either with exertion or nocturnally, no edema.  Does however have recurrent falls.  Significant lightheadedness upon standing particularly from the car.  Surprisingly not with his showers or the bathroom.  Fatigue has been relentless and wife dates to the time of pacemaker   Carvedilol by nowstarted at that time  No sleep disordered breathing       DATE TEST EF   1/20 Echo   55-60 %   3/21 Echo  60-65%   9/22 Echo  55-60%    Date Cr K Hgb  1/20 0.8 3.9 13.7  12/20 2.9>>0.7 3.5 10.4  9/22 0.83 4.5         Records and Results Reviewed   Past Medical History:  Diagnosis Date   Acute heart failure (HCC)    AKI (acute kidney injury) (HCC) 02/15/2019   COPD (chronic obstructive pulmonary disease) (HCC)    High cholesterol    Hypertension    Sepsis (HCC) 02/15/2019    Past Surgical History:  Procedure Laterality Date   KNEE ARTHROSCOPY     left   LEAD REVISION/REPAIR N/A 03/25/2018   Procedure: LEAD REVISION/REPAIR;  Surgeon: Duke Salvia, MD;  Location: [facility] INVASIVE CV LAB;  Service: Cardiovascular;  Laterality: N/A;   PACEMAKER IMPLANT N/A 03/06/2018   Procedure: PACEMAKER IMPLANT;  Surgeon: Duke Salvia, MD;  Location: [facility] INVASIVE CV LAB;  Service: Cardiovascular;  Laterality: N/A;    Current Meds  Medication Sig   acetaminophen (TYLENOL) 500 MG tablet Take 1,000 mg by mouth 2 (two) times daily.   albuterol (VENTOLIN HFA) 108 (90 Base) MCG/ACT inhaler Inhale 1 puff into the lungs every 6  (six) hours as needed for wheezing or shortness of breath. Plz see PCP for additional refills.   Albuterol Sulfate 2.5 MG/0.5ML NEBU Inhale 0.5 mLs (2.5 mg total) into the lungs 2 (two) times daily as needed (shortness of breath).   Ascorbic Acid (VITAMIN C) 1000 MG tablet Take 1,000 mg by mouth daily.   aspirin EC 81 MG tablet Take 1 tablet (81 mg total) by mouth daily with breakfast.   atorvastatin (LIPITOR) 40 MG tablet Take 40 mg by mouth at bedtime.   bisoprolol (ZEBETA) 5 MG tablet TAKE 1 TABLET (5 MG TOTAL) BY MOUTH DAILY.   brimonidine (ALPHAGAN) 0.2 % ophthalmic solution 1 drop 2 (two) times daily.   Cholecalciferol (VITAMIN D3) 25 MCG (1000 UT) CAPS Take by mouth.   dorzolamide (TRUSOPT) 2 % ophthalmic solution 1 drop 2 (two) times daily.   famotidine (PEPCID) 20 MG tablet Take 20 mg by mouth daily.   Fluticasone-Umeclidin-Vilant (TRELEGY ELLIPTA) 100-62.5-25 MCG/ACT AEPB Inhale 1 puff into the lungs daily.   Multiple Vitamin (MULTIVITAMIN WITH MINERALS) TABS tablet Take 1 tablet by mouth every evening.   pantoprazole (PROTONIX) 40 MG tablet Take 1 tablet (40 mg total) by mouth daily. Take 30-60 min before first meal of the day   predniSONE (DELTASONE) 10 MG tablet Take 10 mg by mouth daily with breakfast.   zinc  gluconate 50 MG tablet Take 50 mg by mouth every evening.     Allergies  Allergen Reactions   Bee Venom Anaphylaxis and Swelling      Review of Systems negative except from HPI and PMH  Physical Exam BP 138/78   Pulse 92   Ht 6' (1.829 m)   Wt 181 lb (82.1 kg)   SpO2 96%   BMI 24.55 kg/m   Well developed and well nourished in no acute distress cushingoid  HENT normal Neck supple with JVP-flat Clear Device pocket well healed; without hematoma or erythema.  There is no tethering  Regular rate and rhythm, no  gallop No  murmur Abd-soft with active BS No Clubbing cyanosis  edema Skin-warm and dry A & Oriented  Grossly normal sensory and motor function  ECG  sinus @ 85 20/14/43     Assessment and  Plan   CHB-high-grade    Syncope   Hypertension   Dyspnea on exertion   Pacemaker-Saint Jude status post RV lead revision  Orthostatic falls  Anemia   Blood pressure is well-controlled.  Somehow Diamox (presumed) has been temporally associated with no further falls.  Not sure I understand it but him thrilled.  Will continue the bisoprolol, switch by Dr. [name] in the context of his COPD.  Device function is normal.  No interval syncope.

## 2022-02-08 NOTE — Patient Instructions (Signed)
Medication Instructions:  Your physician recommends that you continue on your current medications as directed. Please refer to the Current Medication list given to you today.  *If you need a refill on your cardiac medications before your next appointment, please call your pharmacy*   Lab Work: CBC today If you have labs (blood work) drawn today and your tests are completely normal, you will receive your results only by: MyChart Message (if you have MyChart) OR A paper copy in the mail If you have any lab test that is abnormal or we need to change your treatment, we will call you to review the results.   Testing/Procedures: None ordered.    Follow-Up: At [facility], you and your health needs are our priority.  As part of our continuing mission to provide you with exceptional heart care, we have created designated Provider Care Teams.  These Care Teams include your primary Cardiologist (physician) and Advanced Practice Providers (APPs -  Physician Assistants and Nurse Practitioners) who all work together to provide you with the care you need, when you need it.  We recommend signing up for the patient portal called "MyChart".  Sign up information is provided on this After Visit Summary.  MyChart is used to connect with patients for Virtual Visits (Telemedicine).  Patients are able to view lab/test results, encounter notes, upcoming appointments, etc.  Non-urgent messages can be sent to your provider as well.   To learn more about what you can do with MyChart, go to [email or web address].    Your next appointment:   12 months with Dr Klein  Important Information About Sugar       

## 2022-02-14 ENCOUNTER — Telehealth: Payer: Self-pay

## 2022-02-14 NOTE — Telephone Encounter (Signed)
-----   Message from Duke Salvia, MD sent at 02/10/2022  4:06 PM EST ----- Please Inform Patient  Labs are normal x elevated WBC  Needs repeat with Diff  and he will need to followup with his PCP about this   Thanks

## 2022-02-14 NOTE — Telephone Encounter (Signed)
Spoke with pt and advised of lab result per Dr Graciela Husbands as below.  Pt verbalizes understanding and agrees with current plan.   Please Inform Patient   Labs are normal x elevated WBC  Needs repeat with Diff  and he will need to followup with his PCP about this   Thanks SK

## 2022-03-08 IMAGING — DX DG CHEST 2V
2 series · 2 of 2 positions shown · non-contrast
Comparison: X-ray chest 11/09/2020.

CLINICAL DATA: Pneumonia, followup. Chronic shortness of breath and
cough. Chronic obstructive pulmonary disease. History of pacemaker
insertion.

EXAM:
CHEST - 2 VIEW

[chest pa]
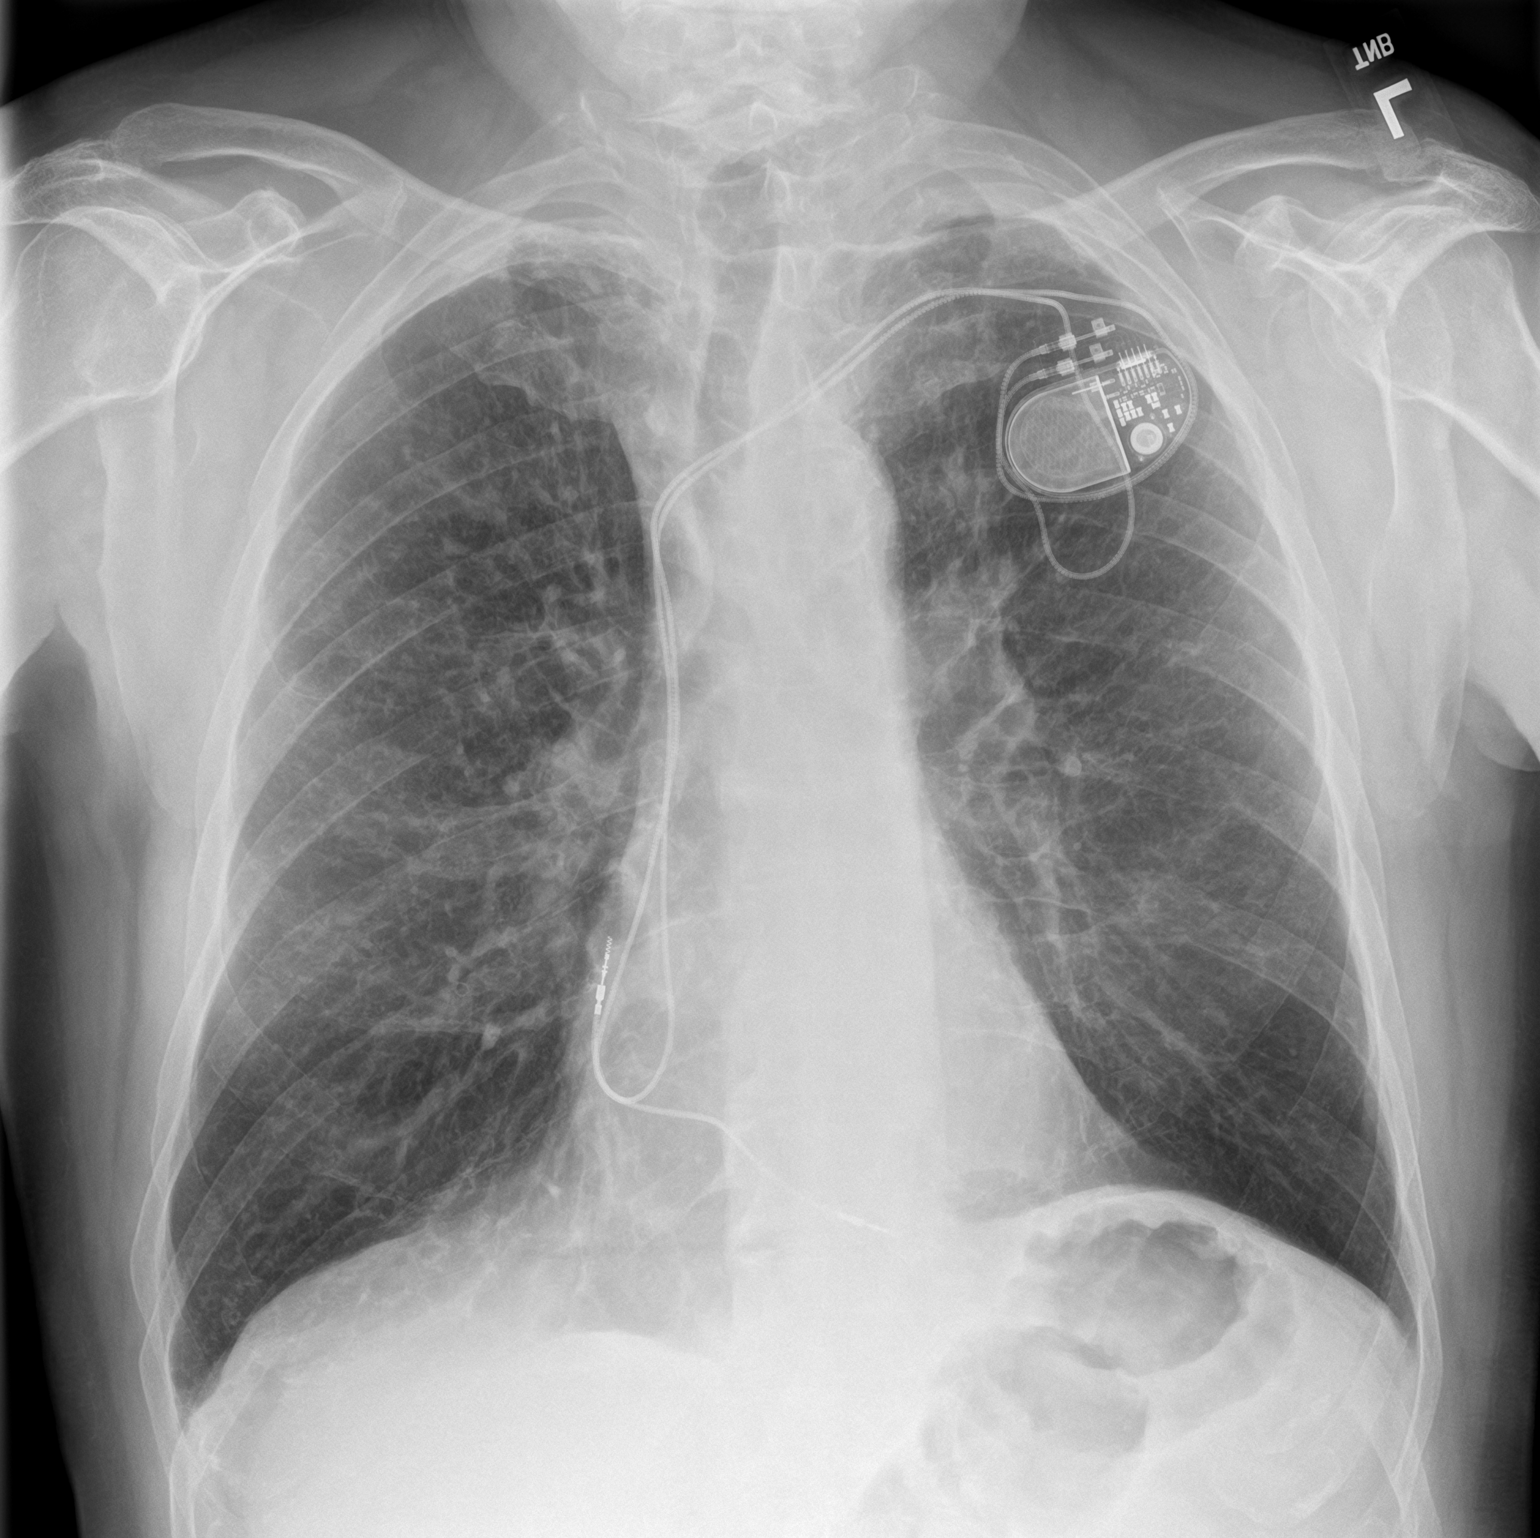

[chest lat]
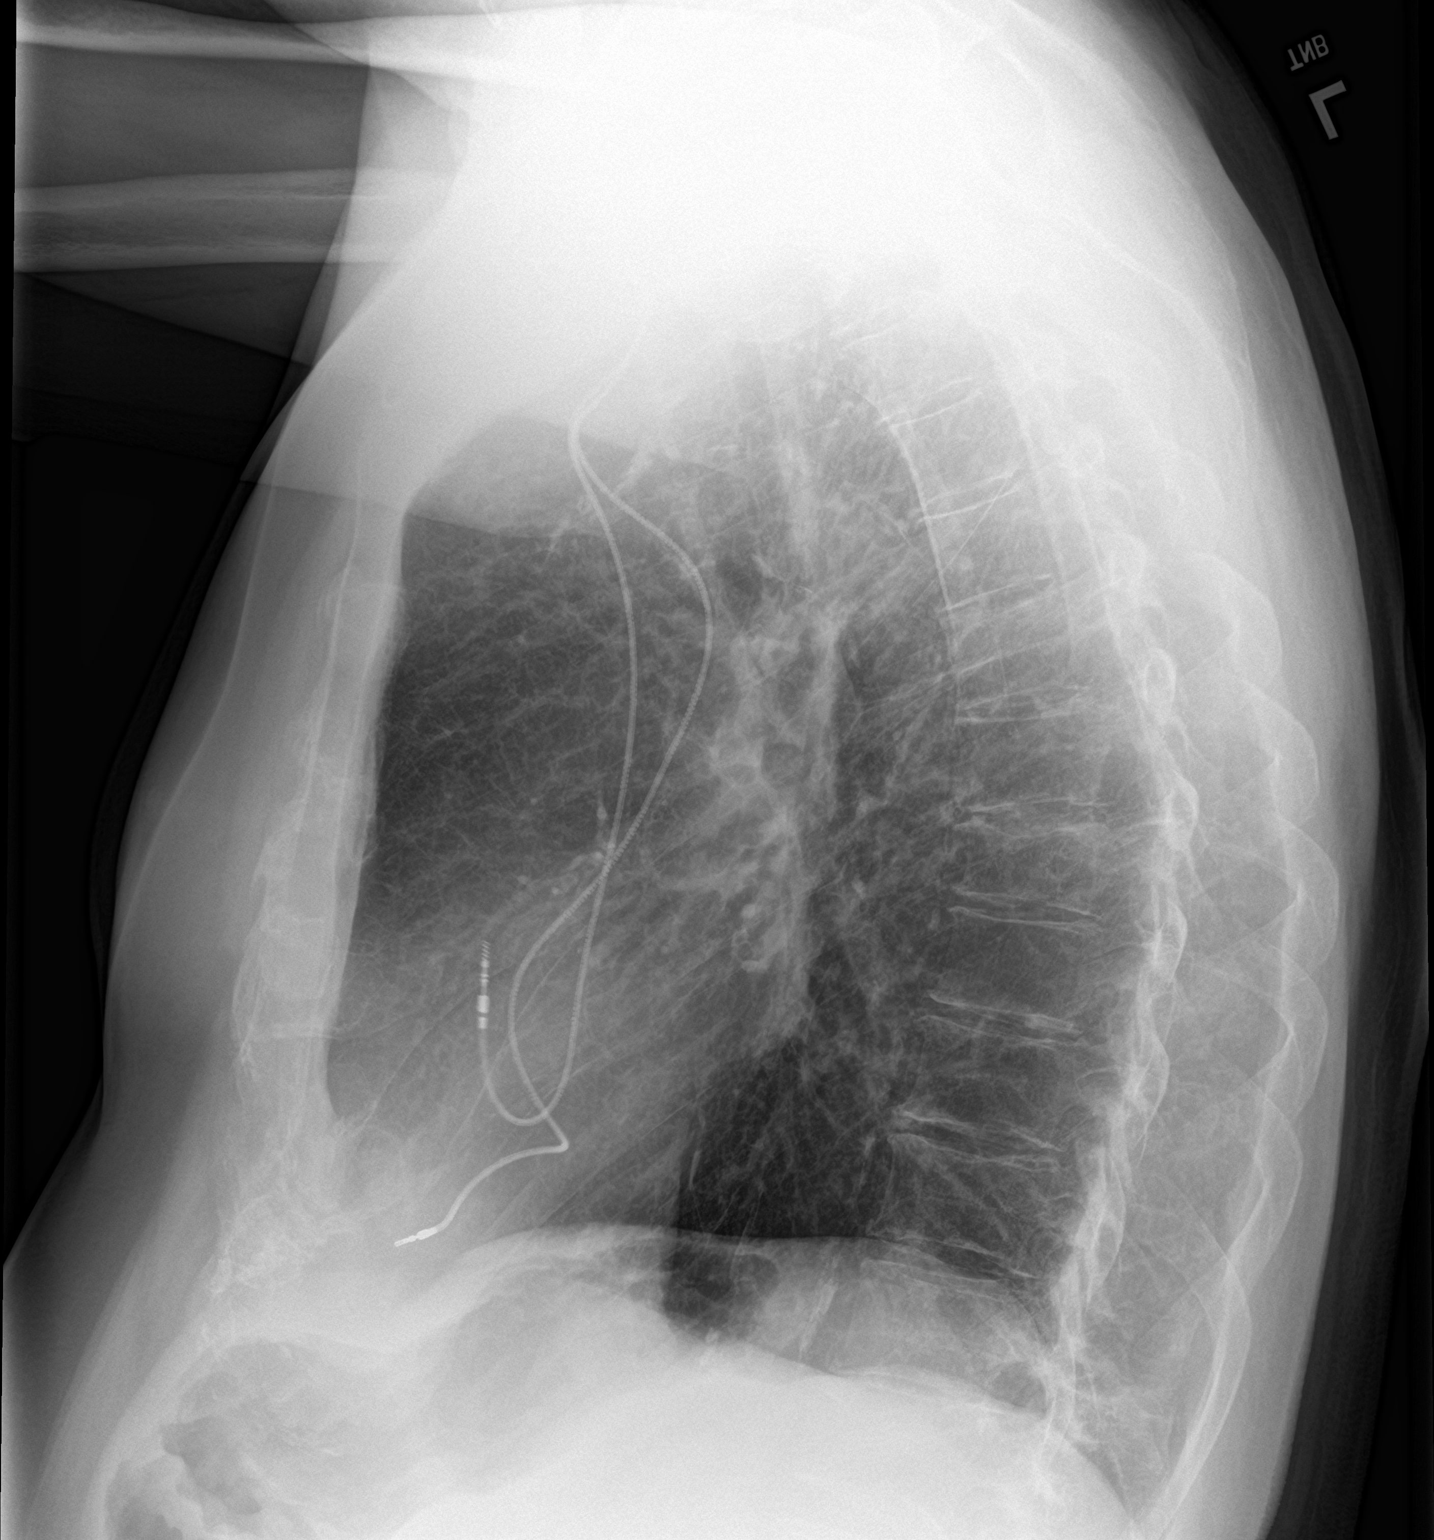

[2 of 2 positions shown; findings below may reference images not displayed]

FINDINGS: Lungs are hyperinflated as can be seen with COPD. Bilateral chronic
interstitial lung disease. No focal consolidation. No pleural
effusion or pneumothorax. Heart and mediastinal contours are
unremarkable. Dual lead cardiac pacemaker.

No acute osseous abnormality.
IMPRESSION: No active cardiopulmonary disease.

## 2022-03-27 ENCOUNTER — Ambulatory Visit: Payer: Medicare HMO

## 2022-03-27 DIAGNOSIS — I442 Atrioventricular block, complete: Secondary | ICD-10-CM

## 2022-03-28 LAB — CUP PACEART REMOTE DEVICE CHECK
Battery Remaining Longevity: 76 mo
Battery Remaining Percentage: 60 %
Battery Voltage: 3.01 V
Brady Statistic AP VP Percent: 1 %
Brady Statistic AP VS Percent: 1 %
Brady Statistic AS VP Percent: 65 %
Brady Statistic AS VS Percent: 35 %
Brady Statistic RA Percent Paced: 1 %
Brady Statistic RV Percent Paced: 65 %
Date Time Interrogation Session: 20240129020017
Implantable Lead Connection Status: 753985
Implantable Lead Connection Status: 753985
Implantable Lead Implant Date: 20200108
Implantable Lead Implant Date: 20200108
Implantable Lead Location: 753859
Implantable Lead Location: 753860
Implantable Lead Model: 5076
Implantable Lead Model: 5076
Implantable Pulse Generator Implant Date: 20200108
Lead Channel Impedance Value: 380 Ohm
Lead Channel Impedance Value: 790 Ohm
Lead Channel Pacing Threshold Amplitude: 0.5 V
Lead Channel Pacing Threshold Amplitude: 0.75 V
Lead Channel Pacing Threshold Pulse Width: 0.4 ms
Lead Channel Pacing Threshold Pulse Width: 0.5 ms
Lead Channel Sensing Intrinsic Amplitude: 4.4 mV
Lead Channel Sensing Intrinsic Amplitude: 8.2 mV
Lead Channel Setting Pacing Amplitude: 0.75 V
Lead Channel Setting Pacing Amplitude: 2 V
Lead Channel Setting Pacing Pulse Width: 0.4 ms
Lead Channel Setting Sensing Sensitivity: 3 mV
Pulse Gen Model: 2272
Pulse Gen Serial Number: 9091999

## 2022-05-12 NOTE — Progress Notes (Signed)
Remote pacemaker transmission.   

## 2022-06-26 ENCOUNTER — Ambulatory Visit (INDEPENDENT_AMBULATORY_CARE_PROVIDER_SITE_OTHER): Payer: Medicare HMO

## 2022-06-26 DIAGNOSIS — I442 Atrioventricular block, complete: Secondary | ICD-10-CM

## 2022-06-27 LAB — CUP PACEART REMOTE DEVICE CHECK
Battery Remaining Longevity: 73 mo
Battery Remaining Percentage: 58 %
Battery Voltage: 3.01 V
Brady Statistic AP VP Percent: 1 %
Brady Statistic AP VS Percent: 1 %
Brady Statistic AS VP Percent: 76 %
Brady Statistic AS VS Percent: 24 %
Brady Statistic RA Percent Paced: 1 %
Brady Statistic RV Percent Paced: 76 %
Date Time Interrogation Session: 20240429023627
Implantable Lead Connection Status: 753985
Implantable Lead Connection Status: 753985
Implantable Lead Implant Date: 20200108
Implantable Lead Implant Date: 20200108
Implantable Lead Location: 753859
Implantable Lead Location: 753860
Implantable Lead Model: 5076
Implantable Lead Model: 5076
Implantable Pulse Generator Implant Date: 20200108
Lead Channel Impedance Value: 360 Ohm
Lead Channel Impedance Value: 790 Ohm
Lead Channel Pacing Threshold Amplitude: 0.5 V
Lead Channel Pacing Threshold Amplitude: 0.75 V
Lead Channel Pacing Threshold Pulse Width: 0.4 ms
Lead Channel Pacing Threshold Pulse Width: 0.5 ms
Lead Channel Sensing Intrinsic Amplitude: 4 mV
Lead Channel Sensing Intrinsic Amplitude: 8.5 mV
Lead Channel Setting Pacing Amplitude: 0.75 V
Lead Channel Setting Pacing Amplitude: 2 V
Lead Channel Setting Pacing Pulse Width: 0.4 ms
Lead Channel Setting Sensing Sensitivity: 3 mV
Pulse Gen Model: 2272
Pulse Gen Serial Number: 9091999

## 2022-07-28 NOTE — Progress Notes (Signed)
Remote pacemaker transmission.   

## 2022-09-04 ENCOUNTER — Other Ambulatory Visit: Payer: Self-pay | Admitting: Internal Medicine

## 2022-09-14 ENCOUNTER — Other Ambulatory Visit: Payer: Self-pay | Admitting: Internal Medicine

## 2022-09-25 ENCOUNTER — Ambulatory Visit (INDEPENDENT_AMBULATORY_CARE_PROVIDER_SITE_OTHER): Payer: Medicare HMO

## 2022-09-25 DIAGNOSIS — I442 Atrioventricular block, complete: Secondary | ICD-10-CM | POA: Diagnosis not present

## 2022-09-29 ENCOUNTER — Encounter: Payer: Self-pay | Admitting: *Deleted

## 2022-10-12 NOTE — Progress Notes (Signed)
Remote pacemaker transmission.   

## 2022-10-30 ENCOUNTER — Other Ambulatory Visit: Payer: Self-pay | Admitting: Internal Medicine

## 2022-11-29 ENCOUNTER — Other Ambulatory Visit: Payer: Self-pay | Admitting: Internal Medicine

## 2022-12-19 ENCOUNTER — Encounter: Payer: Self-pay | Admitting: Internal Medicine

## 2022-12-19 ENCOUNTER — Ambulatory Visit: Payer: Medicare HMO | Admitting: Internal Medicine

## 2022-12-19 VITALS — BP 121/58 | HR 96 | Ht 72.0 in | Wt 175.0 lb

## 2022-12-19 DIAGNOSIS — J418 Mixed simple and mucopurulent chronic bronchitis: Secondary | ICD-10-CM | POA: Insufficient documentation

## 2022-12-19 DIAGNOSIS — J984 Other disorders of lung: Secondary | ICD-10-CM | POA: Insufficient documentation

## 2022-12-19 DIAGNOSIS — F17200 Nicotine dependence, unspecified, uncomplicated: Secondary | ICD-10-CM | POA: Insufficient documentation

## 2022-12-19 DIAGNOSIS — F17210 Nicotine dependence, cigarettes, uncomplicated: Secondary | ICD-10-CM

## 2022-12-19 DIAGNOSIS — J449 Chronic obstructive pulmonary disease, unspecified: Secondary | ICD-10-CM

## 2022-12-19 DIAGNOSIS — E785 Hyperlipidemia, unspecified: Secondary | ICD-10-CM | POA: Insufficient documentation

## 2022-12-19 DIAGNOSIS — D72829 Elevated white blood cell count, unspecified: Secondary | ICD-10-CM | POA: Insufficient documentation

## 2022-12-19 MED ORDER — TRELEGY ELLIPTA 100-62.5-25 MCG/ACT IN AEPB: 1.0000 | INHALATION_SPRAY | Freq: Every day | RESPIRATORY_TRACT | 5 refills | Status: DC

## 2022-12-19 NOTE — Patient Instructions (Addendum)
My office will be contacting you by phone for referral to Low dose CT chest   - if you don't hear back from my office within one week please call us back or notify us thru MyChart and we'll address it right away.   For cough/ congestion > mucinex or mucinex dm  up to maximum of  1200 mg every 12 hours as needed   Start Ohtuvayre nebulizers twice daily   Prednisone should be able to taper to 10 mg - 5 mg - 10 mg   Please schedule a follow up office visit in 6 weeks, call sooner if needed

## 2022-12-19 NOTE — Progress Notes (Unsigned)
Eric Diaz, male    DOB: 10/25/1945      MRN: [id number]   Brief patient profile:  79 yowm MM/ active smoker onset sob around 2018 on prn saba then trelegy started around 2019 and steroid dep at 10 mg /day with 3 flares since May 2022 while in [place] referred to pulmonary clinic in [place]  09/10/2020 by EDP      History of Present Illness  09/10/2020  Pulmonary/ 1st office eval/ [name] / [place] [facility]  Chief Complaint  Patient presents with   Pulmonary Consult    Referred by [facility]- pt with COPD and frequent flare ups since June 2022.  He states wheezing some today.   Dyspnea:  worse just today finished pred 4 days prior to OV   Cough: better slt yellowish  Sleep: better p last ER / flat bed with pillow SABA use: neb 4 h prior to OV  02 none  Rec I recommend you get off the coreg  and the substitute is called bisoprolol 5 mg one daily  Plan A = Automatic = Always=    Trelegy 100 one click  first thing each am  Prednisone 10 mg daily - take 2 daily if not doing great  Pantoprazole (protonix) 40 mg   Take  30-60 min before first meal of the day and Pepcid (famotidine)  20 mg after supper until return to office - this is the best way to tell whether stomach acid is contributing to your problem.   Plan B = Backup (to supplement plan A, not to replace it) Only use your albuterol inhaler as a rescue medication Plan C = Crisis (instead of Plan B but only if Plan B stops working) - only use your albuterol nebulizer if you first try Plan B and it fails to help > ok to use the nebulizer up to every 4 hours but if start needing it regularly call for immediate appointment GERD diet reviewed, bed blocks rec  Please schedule a follow up office visit in 4 weeks, sooner if needed  with all medications /inhalers/ solutions in hand so we can verify exactly what you are taking. 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 He has used his albuterol inhaler once in the past wk and it helped.    Dyspnea:  extremely sedentary / walks with cane due to balance  Cough: minimal  Sleeping: 45 degrees recliner due to back  SABA use: as above  02: none  Covid status: x 3  Lung cancer screening: advised  Rec Allergy eval > neg skin testing 11/24/20    02/07/2021  f/u ov/[place] office/Iniko Robles re: ?ACOS  maint on trelegy / prednisone 10 mg one half  daily  Chief Complaint  Patient presents with   Follow-up    Breathing and wheezing have improved since last OV.   Hosp stay for pneumonia in September 2022.    Dyspnea:  more mobile with rollator/ limited by balance not sob  Cough: minimal  Sleeping: now able to lie flat one pillow SABA use: rare hfa  rare neb  02: none  - sats run 92% walking when he checks  them  Lung cancer screening: due 10/2018 Rec  No change in medications      09/29/2021  f/u ov/[place] office/Timouthy Gilardi re: GOLD 3  maint on Trelegy and pred 20 mg daily  with lowest 5 mg x 3 y Chief Complaint  Patient presents with   Follow-up    Doing well since last ov   Dyspnea:  500 ft to shed flat / did not need to stop/ also limited by back/leg pains even when breathing ok   Cough: minimal cough Sleeping: flat bed / one pillow  SABA use: alb hfa every 2-3 days  02: none  Covid status:  vax x 3  Lung cancer screening: advised for Sept 2023  Rec For nasal symptoms see Dr Dellis Anes > not seen  [facility] to try albuterol 15 min before an activity (on alternating days)  that you know would usually make you short of breath     12/19/2022  f/u ov/[place] office/Alegandra Sommers re: GOLD 3 copd  maint on trelegy and prednisone 10 mg  - breathing much worse of 5 mg Chief Complaint  Patient presents with   COPD     Gold  Dyspnea:  shop and back about the same  Cough: first thing in am clear Sleeping: flat bed one pillow s resp cc  SABA use: once a day / rare - just with aecopd 02: none   Lung cancer screening: ordered today    No obvious day to day or daytime variability or assoc  purulent sputum or mucus plugs or hemoptysis or cp or chest tightness, subjective wheeze or overt sinus or hb symptoms.    Also denies any obvious fluctuation of symptoms with weather or environmental changes or other aggravating or alleviating factors except as outlined above   No unusual exposure hx or h/o childhood pna/ asthma or knowledge of premature birth.  Current Allergies, Complete Past Medical History, Past Surgical History, Family History, and Social History were reviewed in [name].  ROS  The following are not active complaints unless bolded Hoarseness, sore throat, dysphagia, dental problems, itching, sneezing,  nasal congestion or discharge of excess mucus or purulent secretions, ear ache,   fever, chills, sweats, unintended wt loss or wt gain, classically pleuritic or exertional cp,  orthopnea pnd or arm/hand swelling  or leg swelling, presyncope, palpitations, abdominal pain, anorexia, nausea, vomiting, diarrhea  or change in bowel habits or change in bladder habits, change in stools or change in urine, dysuria, hematuria,  rash, arthralgias, visual complaints, headache, numbness, weakness or ataxia or problems with walking or coordination,  change in mood or  memory.        Current Meds  Medication Sig   acetaminophen (TYLENOL) 500 MG tablet Take 1,000 mg by mouth 2 (two) times daily.   albuterol (VENTOLIN HFA) 108 (90 Base) MCG/ACT inhaler Inhale 1 puff into the lungs every 6 (six) hours as needed for wheezing or shortness of breath. Plz see PCP for additional refills.   Albuterol Sulfate 2.5 MG/0.5ML NEBU Inhale 0.5 mLs (2.5 mg total) into the lungs 2 (two) times daily as  needed (shortness of breath).   Ascorbic Acid (VITAMIN C) 1000 MG tablet Take 1,000 mg by mouth daily.   aspirin EC 81 MG tablet Take 1 tablet (81 mg total) by mouth daily with breakfast.   atorvastatin (LIPITOR) 40 MG tablet Take 40 mg by mouth at bedtime.   bisoprolol (ZEBETA) 5 MG tablet TAKE 1 TABLET (5 MG TOTAL) BY  MOUTH DAILY.   Cholecalciferol (VITAMIN D3) 25 MCG (1000 UT) CAPS Take by mouth.   dorzolamide (TRUSOPT) 2 % ophthalmic solution 1 drop 2 (two) times daily.   famotidine (PEPCID) 20 MG tablet TAKE 1 TABLET BY MOUTH EVERYDAY AFTER SUPPER   Fluticasone-Umeclidin-Vilant (TRELEGY ELLIPTA) 100-62.5-25 MCG/ACT AEPB Inhale 1 puff into the lungs daily. PLEASE CONTACT OUR OFFICE TO SCHEDULE AN OVERDUE APPOINTMENT [phone]   Multiple Vitamin (MULTIVITAMIN WITH MINERALS) TABS tablet Take 1 tablet by mouth every evening.   pantoprazole (PROTONIX) 40 MG tablet TAKE 1 TABLET (40 MG TOTAL) BY MOUTH DAILY. TAKE 30-60 MIN BEFORE FIRST MEAL OF THE DAY   predniSONE (DELTASONE) 10 MG tablet Take 10 mg by mouth daily with breakfast.   traMADol (ULTRAM) 50 MG tablet Take 50 mg by mouth every 6 (six) hours as needed.   zinc gluconate 50 MG tablet Take 50 mg by mouth every evening.           Past Medical History:  Diagnosis Date   COPD (chronic obstructive pulmonary disease) (HCC)    High cholesterol    Hypertension     Objective:    Wts  12/19/2022      175   09/29/2021         186  02/07/2021     180  10/06/20 194 lb (88 kg)  09/10/20 187 lb (84.8 kg)  08/22/20 192 lb (87.1 kg)     Vital signs reviewed  12/19/2022  - Note at rest 02 sats  97% on RA   General appearance:    amb wm nad / mildly congested sounding cough  HEENT :  Oropharynx  clear      NECK :  without JVD/Nodes/TM/ nl carotid upstrokes bilaterally   LUNGS: no acc muscle use,  Mod barrel  contour chest wall with bilateral  Distant bs s audible wheeze and  without cough on insp or exp maneuvers and mod   Hyperresonant  to  percussion bilaterally     CV:  RRR  no s3 or murmur or increase in P2, and no edema   ABD:  soft and nontender with pos mid insp Hoover's  in the supine position. No bruits or organomegaly appreciated, bowel sounds nl  MS:   Ext warm without deformities or   obvious joint restrictions , calf tenderness, cyanosis or clubbing  SKIN: warm and dry without lesions    NEURO:  alert, approp, nl sensorium with  no motor or cerebellar deficits apparent.         Assessment

## 2022-12-20 NOTE — Assessment & Plan Note (Signed)
Quit smoking 07/2020  - PFT's  11/17/19 FEV1 1.15 (39 % ) ratio 0.45  p 2  % improvement from saba p ? 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

## 2022-12-20 NOTE — Assessment & Plan Note (Signed)
Referred for annual lung cancer screeing due q September 09/29/2021 >>> not done  - 12/19/2022 ordered LDSCT   Counseled re importance of smoking cessation but did not meet time criteria for separate billing    Low-dose CT lung cancer screening is recommended for patients who are 68-76 years of age with a 20+ pack-year history of smoking and who are currently smoking or quit <=15 years ago. No coughing up blood  No unintentional weight loss of > 15 pounds in the last 6 months - pt is eligible for scanning yearly until age 73 > ordered   Discussed in detail all the  indications, usual  risks and alternatives  relative to the benefits with patient who agrees to proceed with w/u as outlined.            Each maintenance medication was reviewed in detail including emphasizing most importantly the difference between maintenance and prns and under what circumstances the prns are to be triggered using an action plan format where appropriate.  Total time for H and P, chart review, counseling, reviewing dpi/ neb device(s) and generating customized AVS unique to this office visit / same day charting  > 40 min for multiple  refractory respiratory  symptoms

## 2022-12-24 ENCOUNTER — Ambulatory Visit (HOSPITAL_COMMUNITY): Payer: Medicare HMO

## 2022-12-25 ENCOUNTER — Ambulatory Visit (INDEPENDENT_AMBULATORY_CARE_PROVIDER_SITE_OTHER): Payer: Medicare HMO

## 2022-12-25 DIAGNOSIS — I442 Atrioventricular block, complete: Secondary | ICD-10-CM

## 2022-12-25 LAB — CUP PACEART REMOTE DEVICE CHECK
Battery Remaining Longevity: 68 mo
Battery Remaining Percentage: 53 %
Battery Voltage: 2.99 V
Brady Statistic AP VP Percent: 1 %
Brady Statistic AP VS Percent: 1 %
Brady Statistic AS VP Percent: 88 %
Brady Statistic AS VS Percent: 11 %
Brady Statistic RA Percent Paced: 1 %
Brady Statistic RV Percent Paced: 89 %
Date Time Interrogation Session: 20241028035144
Implantable Lead Connection Status: 753985
Implantable Lead Connection Status: 753985
Implantable Lead Implant Date: 20200108
Implantable Lead Implant Date: 20200108
Implantable Lead Location: 753859
Implantable Lead Location: 753860
Implantable Lead Model: 5076
Implantable Lead Model: 5076
Implantable Pulse Generator Implant Date: 20200108
Lead Channel Impedance Value: 360 Ohm
Lead Channel Impedance Value: 840 Ohm
Lead Channel Pacing Threshold Amplitude: 0.5 V
Lead Channel Pacing Threshold Amplitude: 0.75 V
Lead Channel Pacing Threshold Pulse Width: 0.4 ms
Lead Channel Pacing Threshold Pulse Width: 0.5 ms
Lead Channel Sensing Intrinsic Amplitude: 4.6 mV
Lead Channel Sensing Intrinsic Amplitude: 6.4 mV
Lead Channel Setting Pacing Amplitude: 0.75 V
Lead Channel Setting Pacing Amplitude: 2 V
Lead Channel Setting Pacing Pulse Width: 0.4 ms
Lead Channel Setting Sensing Sensitivity: 3 mV
Pulse Gen Model: 2272
Pulse Gen Serial Number: 9091999

## 2023-01-15 ENCOUNTER — Ambulatory Visit (HOSPITAL_COMMUNITY)
Admission: RE | Admit: 2023-01-15 | Discharge: 2023-01-15 | Disposition: A | Discharge status: Home / Self Care | Payer: Medicare HMO | Source: Ambulatory Visit | Attending: Internal Medicine | Admitting: Internal Medicine

## 2023-01-15 DIAGNOSIS — F17210 Nicotine dependence, cigarettes, uncomplicated: Secondary | ICD-10-CM | POA: Diagnosis present

## 2023-01-15 NOTE — Progress Notes (Signed)
Remote pacemaker transmission.   

## 2023-01-16 ENCOUNTER — Other Ambulatory Visit: Payer: Self-pay | Admitting: Internal Medicine

## 2023-01-17 NOTE — Telephone Encounter (Signed)
Patient has been told by [facility] in [place], [place] to call our office for [place] to resend the refill for his Trelegy--pharmacy is stating they did not receive anything from our office--[facility] [place], [place]  [phone]---[id number] call back is [phone]

## 2023-01-28 NOTE — Progress Notes (Unsigned)
Eric Diaz, male    DOB: 1945/07/27      MRN: [id number]   Brief patient profile:  62 yowm MM/ active smoker onset sob around 2018 on prn saba then trelegy started around 2019 and steroid dep at 10 mg /day with 3 flares since May 2022 while in [place] referred to pulmonary clinic in [place]  09/10/2020 by EDP     History of Present Illness  09/10/2020  Pulmonary/ 1st office eval/ Eric Diaz / [facility]  Chief Complaint  Patient presents with   Pulmonary Consult    Referred by [facility]- pt with COPD and frequent flare ups since June 2022.  He states wheezing some today.   Dyspnea:  worse just today finished pred 4 days prior to OV   Cough: better slt yellowish  Sleep: better p last ER / flat bed with pillow SABA use: neb 4 h prior to OV  02 none  Rec I recommend you get off the coreg  and the substitute is called bisoprolol 5 mg one daily  Plan A = Automatic = Always=    Trelegy 100 one click  first thing each am  Prednisone 10 mg daily - take 2 daily if not doing great  Pantoprazole (protonix) 40 mg   Take  30-60 min before first meal of the day and Pepcid (famotidine)  20 mg after supper until return to office - this is the best way to tell whether stomach acid is contributing to your problem.   Plan B = Backup (to supplement plan A, not to replace it) Only use your albuterol inhaler as a rescue medication Plan C = Crisis (instead of Plan B but only if Plan B stops working) - only use your albuterol nebulizer if you first try Plan B and it fails to help > ok to use the nebulizer up to every 4 hours but if start needing it regularly call for immediate appointment GERD diet reviewed, bed blocks rec  Please schedule a follow up office visit in 4 weeks, sooner if needed  with all medications /inhalers/ solutions in hand so we can verify exactly what you are taking. This includes all medications from all doctors and over the counters    10/06/2020  f/u ov/[place] office/Eric Diaz re:  atopic / MM/ maint on trelegy  / pred 20 mg one half daily  Chief Complaint  Patient presents with   Follow-up    Breathing overall doing well- not as well as it was before he had seizure approx 1 wk ago. He has used his albuterol inhaler once in the past wk and it helped.    Dyspnea:  extremely sedentary / walks with cane due to balance  Cough: minimal  Sleeping: 45 degrees recliner due to back  SABA use: as above  02: none  Covid status: x 3  Lung cancer screening: advised  Rec Allergy eval > neg skin testing 11/24/20    02/07/2021  f/u ov/[place] office/Eric Diaz re: ?ACOS  maint on trelegy / prednisone 10 mg one half  daily  Chief Complaint  Patient presents with   Follow-up    Breathing and wheezing have improved since last OV.   Hosp stay for pneumonia in September 2022.    Dyspnea:  more mobile with rollator/ limited by balance not sob  Cough: minimal  Sleeping: now able to lie flat one pillow SABA use: rare hfa  rare neb  02: none  - sats run 92% walking when he checks them  Lung cancer screening: due 10/2018 Rec  No change in medications      09/29/2021  f/u ov/[place] office/Eric Diaz re: GOLD 3  maint on Trelegy and pred 20 mg daily  with lowest 5 mg x 3 y Chief Complaint  Patient presents with   Follow-up    Doing well since last ov   Dyspnea:  500 ft to shed flat / did not need to stop/ also limited by back/leg pains even when breathing ok   Cough: minimal cough Sleeping: flat bed / one pillow  SABA use: alb hfa every 2-3 days  02: none  Covid status:  vax x 3  Lung cancer screening: advised for Sept 2023  Rec For nasal symptoms see Dr Dellis Anes > not seen  [facility] to try albuterol 15 min before an activity (on alternating days)  that you know would usually make you short of breath     12/19/2022  f/u ov/[place] office/Eric Diaz re: GOLD 3 copd  maint on trelegy and prednisone 10 mg  - breathing much worse of 5 mg Chief Complaint  Patient presents with   COPD     Gold  Dyspnea:  shop and back about the same  Cough: first thing in am clear Sleeping: flat bed one pillow s resp cc  SABA use: once a day / rare - just with aecopd 02: none  Lung cancer screening: ordered today  Rec  For cough/ congestion > mucinex or mucinex dm  up to maximum of  1200 mg every 12 hours as needed  Start Ohtuvayre nebulizers twice daily  Prednisone should be able to taper to 10 mg - 5 mg - 10 mg    LDSCT  11/18/ 24 still pending at time of ov  01/29/2023    01/29/2023  6 week f/u ov/[place] office/Eric Diaz re: GOLD 3  maint on trelegy 100 and pred 12-01-08   Dyspnea:  shop and back is flat is about sev hundred feet  Cough: minimal x 2-3 coughs > mucoid  Sleeping: flat bed / one pillow s noct    resp cc  SABA use: hfa rarely / ony using neb with aecopd related to uri's 02: none  Lung cancer screening: in process    No obvious day to day or daytime variability or assoc excess/ purulent sputum or mucus plugs or hemoptysis or cp or chest tightness, subjective wheeze or overt sinus or hb symptoms.    Also denies any obvious fluctuation of symptoms with weather or environmental changes or other aggravating or alleviating factors except as outlined above   No unusual exposure hx or h/o childhood pna/ asthma or knowledge of premature birth.  Current Allergies, Complete Past Medical History, Past Surgical History, Family History, and Social History were reviewed in [name].  ROS  The following are not active complaints unless bolded Hoarseness, sore throat, dysphagia, dental problems, itching, sneezing,  nasal congestion or discharge of excess mucus or purulent secretions, ear ache,   fever, chills, sweats, unintended wt loss or wt gain, classically pleuritic or exertional cp,  orthopnea pnd or arm/hand swelling  or leg swelling, presyncope, palpitations, abdominal pain, anorexia, nausea, vomiting, diarrhea  or change in bowel habits or change in  bladder habits, change in stools or change in urine, dysuria, hematuria,  rash, arthralgias, visual complaints, headache, numbness, weakness or ataxia or problems with walking or coordination,  change in mood or  memory.        Current Meds  Medication  Sig   acetaminophen (TYLENOL) 500 MG tablet Take 1,000 mg by mouth 2 (two) times daily.   albuterol (VENTOLIN HFA) 108 (90 Base) MCG/ACT inhaler Inhale 1 puff into the lungs every 6 (six) hours as needed for wheezing or shortness of breath. Plz see PCP for additional refills.   Albuterol Sulfate 2.5 MG/0.5ML NEBU Inhale 0.5 mLs (2.5 mg total) into the lungs 2 (two) times daily as needed (shortness of breath).   Ascorbic Acid (VITAMIN C) 1000 MG tablet Take 1,000 mg by mouth daily.   aspirin EC 81 MG tablet Take 1 tablet (81 mg total) by mouth daily with breakfast.   atorvastatin (LIPITOR) 40 MG tablet Take 40 mg by mouth at bedtime.   bisoprolol (ZEBETA) 5 MG tablet TAKE 1 TABLET (5 MG TOTAL) BY MOUTH DAILY.   Cholecalciferol (VITAMIN D3) 25 MCG (1000 UT) CAPS Take by mouth.   dorzolamide (TRUSOPT) 2 % ophthalmic solution 1 drop 2 (two) times daily.   Fluticasone-Umeclidin-Vilant (TRELEGY ELLIPTA) 100-62.5-25 MCG/ACT AEPB INHALE 1 PUFF INTO THE LUNGS DAILY. PLEASE CONTACT OUR OFFICE TO SCHEDULE AN APPT   Multiple Vitamin (MULTIVITAMIN WITH MINERALS) TABS tablet Take 1 tablet by mouth every evening.   pantoprazole (PROTONIX) 40 MG tablet TAKE 1 TABLET (40 MG TOTAL) BY MOUTH DAILY. TAKE 30-60 MIN BEFORE FIRST MEAL OF THE DAY   predniSONE (DELTASONE) 20 MG tablet Take 20 mg by mouth daily. PATIENT TAKING ALTERNATING DOSES OF 10MG  AND 5MG  EOD   traMADol (ULTRAM) 50 MG tablet Take 50 mg by mouth every 6 (six) hours as needed.   zinc gluconate 50 MG tablet Take 50 mg by mouth every evening.           Past Medical History:  Diagnosis Date   COPD (chronic obstructive pulmonary disease) (HCC)    High cholesterol    Hypertension     Objective:     Wts  01/29/2023         176  12/19/2022      175   09/29/2021         186  02/07/2021     180  10/06/20 194 lb (88 kg)  09/10/20 187 lb (84.8 kg)  08/22/20 192 lb (87.1 kg)     Vital signs reviewed  01/29/2023  - Note at rest 02 sats  96% on RA   General appearance:    soft spoken amb wm nad  / minimal rattle  HEENT :  Oropharynx  clear   Nasal turbinates nl    NECK :  without JVD/Nodes/TM/ nl carotid upstrokes bilaterally   LUNGS: no acc muscle use,  Mod barrel  contour chest wall with bilateral  Distant bs s audible wheeze and  without cough on insp or exp maneuvers and mod  Hyperresonant  to  percussion bilaterally     CV:  RRR  no s3 or murmur or increase in P2, and no edema   ABD:  soft and nontender    MS:   Ext warm without deformities or   obvious joint restrictions , calf tenderness, cyanosis or clubbing  SKIN: warm and dry without lesions    NEURO:  alert, approp, nl sensorium with  no motor or cerebellar deficits apparent.                Assessment

## 2023-01-29 ENCOUNTER — Encounter: Payer: Self-pay | Admitting: Internal Medicine

## 2023-01-29 ENCOUNTER — Ambulatory Visit: Payer: Medicare HMO | Admitting: Internal Medicine

## 2023-01-29 VITALS — BP 124/62 | HR 83 | Ht 72.0 in | Wt 176.0 lb

## 2023-01-29 DIAGNOSIS — F17210 Nicotine dependence, cigarettes, uncomplicated: Secondary | ICD-10-CM | POA: Diagnosis not present

## 2023-01-29 DIAGNOSIS — J449 Chronic obstructive pulmonary disease, unspecified: Secondary | ICD-10-CM | POA: Diagnosis not present

## 2023-01-29 MED ORDER — TRELEGY ELLIPTA 100-62.5-25 MCG/ACT IN AEPB: INHALATION_SPRAY | RESPIRATORY_TRACT | 3 refills | Status: DC

## 2023-01-29 MED ORDER — PREDNISONE 5 MG PO TABS: 5.0000 mg | ORAL_TABLET | Freq: Every day | ORAL | 2 refills | Status: DC

## 2023-01-29 NOTE — Patient Instructions (Addendum)
Try prednisone 5 mg daily if tolerated - if not resume 10-5- 10 as before (alternating days)  Please schedule a follow up visit in 6 months but call sooner if needed

## 2023-01-29 NOTE — Assessment & Plan Note (Addendum)
Quit smoking 07/2020 / pred dependent per PCP since around 2020 - PFT's  11/17/19 FEV1 1.15 (39 % ) ratio 0.45  p 2  % improvement from saba p ? 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    Group D (now reclassified as E) in terms of symptom/risk and laba/lama/ICS  therefore appropriate rx at this point >>>  Trelegy 100 and using approp saba on pred 12-01-08.  The goal with a chronic steroid dependent illness is always arriving at the lowest effective dose that controls the disease/symptoms and not accepting a set "formula" which is based on statistics or guidelines that don't always take into account patient  variability or the natural hx of the dz in every individual patient, which may well vary over time.  For now therefore I recommend the patient maintain  floor of 5 mg daily if tol   F/u can b q 6 m, sooner prn          Each maintenance medication was reviewed in detail including emphasizing most importantly the difference between maintenance and prns and under what circumstances the prns are to be triggered using an action plan format where appropriate.  Total time for H and P, chart review, counseling, reviewing hfa/dpi/neb  device(s) and generating customized AVS unique to this office visit / same day charting = 25 min

## 2023-01-29 NOTE — Assessment & Plan Note (Signed)

## 2023-02-07 NOTE — Progress Notes (Signed)
Called the pt and there was no answer- LMTCB    

## 2023-02-19 NOTE — Progress Notes (Signed)
Tried calling the pt and there was no answer- LMTCB. He does not currently have f/u appt here. Will await call back and mail letter of do not here back soon.

## 2023-02-26 ENCOUNTER — Emergency Department (HOSPITAL_COMMUNITY): Payer: Medicare HMO

## 2023-02-26 ENCOUNTER — Encounter (HOSPITAL_COMMUNITY): Payer: Self-pay | Admitting: *Deleted

## 2023-02-26 ENCOUNTER — Emergency Department (HOSPITAL_COMMUNITY)
Admission: EM | Admit: 2023-02-26 | Discharge: 2023-02-26 | Disposition: A | Discharge status: Home / Self Care | Payer: Medicare HMO | Attending: Emergency Medicine | Admitting: Emergency Medicine

## 2023-02-26 ENCOUNTER — Other Ambulatory Visit: Payer: Self-pay

## 2023-02-26 DIAGNOSIS — J449 Chronic obstructive pulmonary disease, unspecified: Secondary | ICD-10-CM | POA: Diagnosis not present

## 2023-02-26 DIAGNOSIS — Z950 Presence of cardiac pacemaker: Secondary | ICD-10-CM | POA: Insufficient documentation

## 2023-02-26 DIAGNOSIS — Z7982 Long term (current) use of aspirin: Secondary | ICD-10-CM | POA: Diagnosis not present

## 2023-02-26 DIAGNOSIS — D72829 Elevated white blood cell count, unspecified: Secondary | ICD-10-CM | POA: Insufficient documentation

## 2023-02-26 DIAGNOSIS — Z79899 Other long term (current) drug therapy: Secondary | ICD-10-CM | POA: Insufficient documentation

## 2023-02-26 DIAGNOSIS — Z7951 Long term (current) use of inhaled steroids: Secondary | ICD-10-CM | POA: Insufficient documentation

## 2023-02-26 DIAGNOSIS — I10 Essential (primary) hypertension: Secondary | ICD-10-CM | POA: Insufficient documentation

## 2023-02-26 DIAGNOSIS — J189 Pneumonia, unspecified organism: Secondary | ICD-10-CM | POA: Diagnosis not present

## 2023-02-26 DIAGNOSIS — R000 Tachycardia, unspecified: Secondary | ICD-10-CM | POA: Diagnosis not present

## 2023-02-26 DIAGNOSIS — R0602 Shortness of breath: Secondary | ICD-10-CM | POA: Diagnosis present

## 2023-02-26 LAB — COMPREHENSIVE METABOLIC PANEL
ALT: 24 U/L (ref 0–44)
AST: 22 U/L (ref 15–41)
Albumin: 3.4 g/dL — ABNORMAL LOW (ref 3.5–5.0)
Alkaline Phosphatase: 70 U/L (ref 38–126)
Anion gap: 11 (ref 5–15)
BUN: 15 mg/dL (ref 8–23)
CO2: 20 mmol/L — ABNORMAL LOW (ref 22–32)
Calcium: 9.2 mg/dL (ref 8.9–10.3)
Chloride: 105 mmol/L (ref 98–111)
Creatinine, Ser: 0.78 mg/dL (ref 0.61–1.24)
GFR, Estimated: >60 mL/min (ref >60)
Glucose, Bld: 129 mg/dL — ABNORMAL HIGH (ref 70–99)
Potassium: 3.5 mmol/L (ref 3.5–5.1)
Sodium: 136 mmol/L (ref 135–145)
Total Bilirubin: 0.9 mg/dL (ref 0.0–1.2)
Total Protein: 7.4 g/dL (ref 6.5–8.1)

## 2023-02-26 LAB — PROTIME-INR
INR: 1.1 (ref 0.8–1.2)
Prothrombin Time: 14.7 s (ref 11.4–15.2)

## 2023-02-26 LAB — CBC WITH DIFFERENTIAL/PLATELET
Abs Immature Granulocytes: 0.1 10*3/uL — ABNORMAL HIGH (ref 0.00–0.07)
Basophils Absolute: 0.1 10*3/uL (ref 0.0–0.1)
Basophils Relative: 0 %
Eosinophils Absolute: 0.3 10*3/uL (ref 0.0–0.5)
Eosinophils Relative: 2 %
HCT: 41.2 % (ref 39.0–52.0)
Hemoglobin: 13.6 g/dL (ref 13.0–17.0)
Immature Granulocytes: 1 %
Lymphocytes Relative: 4 %
Lymphs Abs: 0.7 10*3/uL (ref 0.7–4.0)
MCH: 32.6 pg (ref 26.0–34.0)
MCHC: 33 g/dL (ref 30.0–36.0)
MCV: 98.8 fL (ref 80.0–100.0)
Monocytes Absolute: 1.6 10*3/uL — ABNORMAL HIGH (ref 0.1–1.0)
Monocytes Relative: 9 %
Neutro Abs: 16.1 10*3/uL — ABNORMAL HIGH (ref 1.7–7.7)
Neutrophils Relative %: 84 %
Platelets: 308 10*3/uL (ref 150–400)
RBC: 4.17 MIL/uL — ABNORMAL LOW (ref 4.22–5.81)
RDW: 12.2 % (ref 11.5–15.5)
WBC: 19 10*3/uL — ABNORMAL HIGH (ref 4.0–10.5)
nRBC: 0 % (ref 0.0–0.2)

## 2023-02-26 LAB — LACTIC ACID, PLASMA
Lactic Acid, Venous: 2.1 mmol/L (ref 0.5–1.9)
Lactic Acid, Venous: 1.1 mmol/L (ref 0.5–1.9)

## 2023-02-26 MED ORDER — AZITHROMYCIN 250 MG PO TABS: 250.0000 mg | ORAL_TABLET | Freq: Every day | ORAL | 0 refills | Status: DC

## 2023-02-26 MED ORDER — AMOXICILLIN-POT CLAVULANATE 875-125 MG PO TABS
1.0000 | ORAL_TABLET | Freq: Once | ORAL | Status: AC
Start: 2023-02-26 — End: 2023-02-26
  Administered 2023-02-26: 1 via ORAL
  Filled 2023-02-26: qty 1

## 2023-02-26 MED ORDER — AMOXICILLIN-POT CLAVULANATE 875-125 MG PO TABS: 1.0000 | ORAL_TABLET | Freq: Two times a day (BID) | ORAL | 0 refills | Status: DC

## 2023-02-26 NOTE — ED Notes (Signed)
Pt given water 

## 2023-02-26 NOTE — ED Provider Notes (Signed)
[place] EMERGENCY DEPARTMENT AT [facility] Provider Note   CSN: [id number] Arrival date & time: 02/26/23  1132     History  Chief Complaint  Patient presents with   Shortness of Breath    Eric Diaz is a 77 y.o. male.   Shortness of Breath  This patient is a 77 year old male, he has a history of COPD, receiving treatment for chronic COPD, he is also treated for some hypertension with bisoprolol, high cholesterol with atorvastatin, he reports that he has been sick for a couple of weeks but over the last 2 days he has had increasing coughing shortness of breath and green phlegm.  He has felt hot but has not measured a fever at home.  Nobody else around him has been sick.  He is using his nebulizers without improvement    Home Medications Prior to Admission medications   Medication Sig Start Date End Date Taking? Authorizing Provider  acetaminophen (TYLENOL) 500 MG tablet Take 1,000 mg by mouth 2 (two) times daily.   Yes [provider]  ACETAZOLAMIDE PO Take 500 mg by mouth every other day.   Yes [provider]  albuterol (VENTOLIN HFA) 108 (90 Base) MCG/ACT inhaler Inhale 1 puff into the lungs every 6 (six) hours as needed for wheezing or shortness of breath. Plz see PCP for additional refills. 09/29/21  Yes Nyoka Cowden, MD  Albuterol Sulfate 2.5 MG/0.5ML NEBU Inhale 0.5 mLs (2.5 mg total) into the lungs 2 (two) times daily as needed (shortness of breath). 11/09/20  Yes Shon Hale, MD  amoxicillin-clavulanate (AUGMENTIN) 875-125 MG tablet Take 1 tablet by mouth every 12 (twelve) hours. 02/26/23  Yes Eber Hong, MD  Ascorbic Acid (VITAMIN C) 1000 MG tablet Take 500 mg by mouth daily.   Yes [provider]  aspirin EC 81 MG tablet Take 1 tablet (81 mg total) by mouth daily with breakfast. 11/09/20  Yes Emokpae, Courage, MD  atorvastatin (LIPITOR) 40 MG tablet Take 40 mg by mouth at bedtime. 01/16/20  Yes [provider]   azithromycin (ZITHROMAX Z-PAK) 250 MG tablet Take 1 tablet (250 mg total) by mouth daily. 500mg  PO day 1, then 250mg  PO days 205 02/26/23  Yes Eber Hong, MD  bisoprolol (ZEBETA) 5 MG tablet TAKE 1 TABLET (5 MG TOTAL) BY MOUTH DAILY. 09/14/22  Yes Nyoka Cowden, MD  Cholecalciferol (VITAMIN D3) 25 MCG (1000 UT) CAPS Take by mouth.   Yes [provider]  Fluticasone-Umeclidin-[facility] ELLIPTA) 100-62.5-25 MCG/ACT AEPB One click each am 01/29/23  Yes Nyoka Cowden, MD  Multiple Vitamin (MULTIVITAMIN WITH MINERALS) TABS tablet Take 1 tablet by mouth every evening.   Yes [provider]  pantoprazole (PROTONIX) 40 MG tablet TAKE 1 TABLET (40 MG TOTAL) BY MOUTH DAILY. TAKE 30-60 MIN BEFORE FIRST MEAL OF THE DAY 09/14/22  Yes Nyoka Cowden, MD  predniSONE (DELTASONE) 5 MG tablet Take 1 tablet (5 mg total) by mouth daily with breakfast. 01/29/23  Yes Nyoka Cowden, MD  traMADol (ULTRAM) 50 MG tablet Take 50 mg by mouth every 6 (six) hours as needed. 11/30/21  Yes [provider]  zinc gluconate 50 MG tablet Take 50 mg by mouth every evening.   Yes [provider]  famotidine (PEPCID) 20 MG tablet TAKE 1 TABLET BY MOUTH EVERYDAY AFTER SUPPER Patient not taking: Reported on 01/29/2023 09/05/22   Nyoka Cowden, MD  predniSONE (DELTASONE) 20 MG tablet Take 20 mg by mouth  daily. PATIENT TAKING ALTERNATING DOSES OF 10MG  AND 5MG  EOD Patient not taking: Reported on 02/26/2023 01/08/23   [provider]      Allergies    Bee venom    Review of Systems   Review of Systems  Respiratory:  Positive for shortness of breath.   All other systems reviewed and are negative.   Physical Exam Updated Vital Signs BP (!) 154/65   Pulse 80   Temp 98.6 F (37 C) (Oral)   Resp (!) 22   Ht 1.829 m (6')   Wt 78 kg   SpO2 93%   BMI 23.33 kg/m  Physical Exam Vitals and nursing note reviewed.  Constitutional:      General: He is not in acute distress.     Appearance: He is well-developed.  HENT:     Head: Normocephalic and atraumatic.     Mouth/Throat:     Pharynx: No oropharyngeal exudate.  Eyes:     General: No scleral icterus.       Right eye: No discharge.        Left eye: No discharge.     Conjunctiva/sclera: Conjunctivae normal.     Pupils: Pupils are equal, round, and reactive to light.  Neck:     Thyroid: No thyromegaly.     Vascular: No JVD.  Cardiovascular:     Rate and Rhythm: Regular rhythm. Tachycardia present.     Heart sounds: Normal heart sounds. No murmur heard.    No friction rub. No gallop.     Comments: Heart rate 105 Pulmonary:     Effort: Pulmonary effort is normal. No respiratory distress.     Breath sounds: Normal breath sounds. No wheezing or rales.  Abdominal:     General: Bowel sounds are normal. There is no distension.     Palpations: Abdomen is soft. There is no mass.     Tenderness: There is no abdominal tenderness.  Musculoskeletal:        General: No tenderness. Normal range of motion.     Cervical back: Normal range of motion and neck supple.     Right lower leg: No edema.     Left lower leg: No edema.  Lymphadenopathy:     Cervical: No cervical adenopathy.  Skin:    General: Skin is warm and dry.     Findings: No erythema or rash.  Neurological:     Mental Status: He is alert.     Coordination: Coordination normal.  Psychiatric:        Behavior: Behavior normal.     ED Results / Procedures / Treatments   Labs (all labs ordered are listed, but only abnormal results are displayed) Labs Reviewed  COMPREHENSIVE METABOLIC PANEL - Abnormal; Notable for the following components:      Result Value   CO2 20 (*)    Glucose, Bld 129 (*)    Albumin 3.4 (*)    All other components within normal limits  LACTIC ACID, PLASMA - Abnormal; Notable for the following components:   Lactic Acid, Venous 2.1 (*)    All other components within normal limits  CBC WITH DIFFERENTIAL/PLATELET - Abnormal;  Notable for the following components:   WBC 19.0 (*)    RBC 4.17 (*)    Neutro Abs 16.1 (*)    Monocytes Absolute 1.6 (*)    Abs Immature Granulocytes 0.10 (*)    All other components within normal limits  CULTURE, BLOOD (ROUTINE X 2)  CULTURE, BLOOD (  ROUTINE X 2)  PROTIME-INR  LACTIC ACID, PLASMA  URINALYSIS, W/ REFLEX TO CULTURE (INFECTION SUSPECTED)    EKG None  Radiology DG Chest 2 View Result Date: 02/26/2023 CLINICAL DATA:  Shortness of breath.  Cough. EXAM: CHEST - 2 VIEW COMPARISON:  Chest radiograph dated February 07, 2021. FINDINGS: The heart size and mediastinal contours are unchanged. Stable left subclavian approach dual lead pacemaker in place. Aortic atherosclerosis. Hyperinflation and flattening of the diaphragms, compatible with COPD. Bilateral interstitial opacities superimposed on chronic interstitial changes. No focal consolidation, sizeable pleural effusion, or pneumothorax. No acute osseous abnormality. IMPRESSION: 1. Bilateral interstitial opacities, superimposed on chronic appearing interstitial changes, may be secondary to atypical infection. 2. COPD. Electronically Signed   By: Hart Robinsons M.D.   On: 02/26/2023 14:03    Procedures Procedures    Medications Ordered in ED Medications  amoxicillin-clavulanate (AUGMENTIN) 875-125 MG per tablet 1 tablet (has no administration in time range)    ED Course/ Medical Decision Making/ A&P                                 Medical Decision Making Amount and/or Complexity of Data Reviewed Labs: ordered. Radiology: ordered.  Risk Prescription drug management.   Even though the patient feels a little bit more short of breath and having some green phlegm he does not appear to have any focal abnormalities on his pulmonary exam, he is not wheezing and has no rales and speaks in full sentences.  He is not tachypneic for me he is breathing about 18 breaths/min with an oxygen of 95 to 96% on room air.  He is  afebrile, his heart rate is slightly tachycardic.  Labs and x-ray will be obtained to make sure he is not developing pneumonia or sepsis, he does not appear to be in distress and is not hypotensive  Prior records reviewed: Multiple office visits for COPD, history of complete heart block status post pacemaker seen cardiology, no recent admissions to the hospital for a couple of years since he had pneumonia in 2022  Labs: Has leukocytosis of just over 19,000, there is no signs of acidosis, metabolic panel is reassuring with normal renal function and electrolytes, lactate is right at 2, no significant elevations  Imaging: I personally viewed and interpret the x-ray which shows bilateral interstitial opacities that could be consistent with some pneumonia.  The patient is not hypoxic or tachycardic, there is no fever or hypotension, he has an isolated leukocytosis with signs and symptoms of pneumonia.  He is willing to take antibiotics, he can continue to treat this at home, he is aware of the indications for return and is agreeable.  At this time I feel the patient is stable for discharge, the patient is agreeable  I have discussed with the patient at the bedside the results, and the meaning of these results.  They have had opportunity to ask questions,  expressed their understanding to the need for follow-up with primary care physician        Final Clinical Impression(s) / ED Diagnoses Final diagnoses:  Community acquired pneumonia, unspecified laterality    Rx / DC Orders ED Discharge Orders          Ordered    amoxicillin-clavulanate (AUGMENTIN) 875-125 MG tablet  Every 12 hours        02/26/23 1506    azithromycin (ZITHROMAX Z-PAK) 250 MG tablet  Daily  02/26/23 1506              Eber Hong, MD 02/26/23 [phone]

## 2023-02-26 NOTE — ED Triage Notes (Signed)
Pt with hx COPD, SOB worse for the past week, productive cough of thick green in color.  Denies any fever.  Unable to see his PCP.

## 2023-02-26 NOTE — Discharge Instructions (Signed)
Your testing today shows that there is some early pneumonia in both of your lungs, your vital signs have been good, you do not need any extra oxygen, your blood counts are elevated which is consistent with having an infection.  For this reason I do want you to take the following medications  Please take Augmentin, 1 tablet twice a day for the next 7 days, this is used to treat infections, it treats a variety of infections including animal bites, bacterial infections, and some gastrointestinal infections.   It is related to amoxicillin so do not take this medication if you are allergic to amoxicillin or penicillin.  effects of medications such as antibiotics include diarrhea which may occur as well as potentially inactivating birth control so if you are using a birth control pill please use an alternative form of birth control for the next 2 weeks.  There is occasions where this antibiotic does not work so if you are not improving within 48 hours you will need to be reevaluated immediately by your doctor or in the emergency department if your symptoms are worsening  Zithromax daily for 5 days  You can take over-the-counter cough and cold medication such as DayQuil or NyQuil as well.  Thank you for allowing [place] to treat you in the emergency department today.  After reviewing your examination and potential testing that was done it appears that you are safe to go home.  I would like for you to follow-up with your doctor within the next several days, have them obtain your records and follow-up with them to review all potential tests and results from your visit.  If you should develop severe or worsening symptoms return to the emergency department immediately

## 2023-02-26 NOTE — ED Notes (Signed)
Both sets of blood cultures obtained before any antibiotic administration  

## 2023-02-26 NOTE — ED Notes (Signed)
Date and time results received: 02/26/23 1:14 PM  (use smartphrase ".now" to insert current time)  Test: Lactic Acid Critical Value: 2.1  Name of Provider Notified: Dr. Hyacinth Meeker  Orders Received? Or Actions Taken?:

## 2023-02-26 NOTE — ED Notes (Signed)
Pt given a urinal.

## 2023-02-26 NOTE — ED Notes (Signed)
Pt had a BM

## 2023-03-03 LAB — CULTURE, BLOOD (ROUTINE X 2)
Culture: NO GROWTH
Culture: NO GROWTH
Special Requests: ADEQUATE

## 2023-03-15 ENCOUNTER — Encounter: Payer: Self-pay | Admitting: *Deleted

## 2023-03-15 NOTE — Progress Notes (Signed)
Called the pt and there was no answer- I have mailed letter.

## 2023-03-26 ENCOUNTER — Ambulatory Visit: Payer: Medicare HMO

## 2023-03-26 ENCOUNTER — Telehealth: Payer: Self-pay | Admitting: Internal Medicine

## 2023-03-26 DIAGNOSIS — I442 Atrioventricular block, complete: Secondary | ICD-10-CM | POA: Diagnosis not present

## 2023-03-26 LAB — CUP PACEART REMOTE DEVICE CHECK
Battery Remaining Longevity: 64 mo
Battery Remaining Percentage: 51 %
Battery Voltage: 2.99 V
Brady Statistic AP VP Percent: 1 %
Brady Statistic AP VS Percent: 1 %
Brady Statistic AS VP Percent: 90 %
Brady Statistic AS VS Percent: 9 %
Brady Statistic RA Percent Paced: 1 %
Brady Statistic RV Percent Paced: 90 %
Date Time Interrogation Session: 20250127020015
Implantable Lead Connection Status: 753985
Implantable Lead Connection Status: 753985
Implantable Lead Implant Date: 20200108
Implantable Lead Implant Date: 20200108
Implantable Lead Location: 753859
Implantable Lead Location: 753860
Implantable Lead Model: 5076
Implantable Lead Model: 5076
Implantable Pulse Generator Implant Date: 20200108
Lead Channel Impedance Value: 360 Ohm
Lead Channel Impedance Value: 810 Ohm
Lead Channel Pacing Threshold Amplitude: 0.5 V
Lead Channel Pacing Threshold Amplitude: 0.75 V
Lead Channel Pacing Threshold Pulse Width: 0.4 ms
Lead Channel Pacing Threshold Pulse Width: 0.5 ms
Lead Channel Sensing Intrinsic Amplitude: 12 mV
Lead Channel Sensing Intrinsic Amplitude: 3.8 mV
Lead Channel Setting Pacing Amplitude: 0.75 V
Lead Channel Setting Pacing Amplitude: 2 V
Lead Channel Setting Pacing Pulse Width: 0.4 ms
Lead Channel Setting Sensing Sensitivity: 3 mV
Pulse Gen Model: 2272
Pulse Gen Serial Number: 9091999

## 2023-03-26 NOTE — Telephone Encounter (Signed)
PT wife(DPR) calling about letter. Needs results. Verlon Au has tried to reach out a few times. Her # is [phone]

## 2023-03-27 ENCOUNTER — Encounter: Payer: Self-pay | Admitting: Internal Medicine

## 2023-03-27 NOTE — Telephone Encounter (Signed)
Called and spoke w/ regarding results spoke w pt's wife , also made 3 month follow up.

## 2023-03-27 NOTE — Telephone Encounter (Signed)
There were no findings on the study of any significance though it was poor study  - nothing needed for now except to set up f/u ov in 3 months

## 2023-03-27 NOTE — Telephone Encounter (Signed)
I called and spoke with the pt. Pt received a letter from our office stating we were trying to reach him regarding results. Pt states he never received the results for his CT scan. Please advise.

## 2023-03-30 NOTE — Telephone Encounter (Signed)
 NFN

## 2023-04-02 LAB — HEMOGLOBIN A1C: A1c: 5.6

## 2023-04-02 LAB — T4, FREE: Free T4: 1.21 ng/dL

## 2023-04-02 LAB — TSH: TSH: 1.22 (ref 0.41–5.90)

## 2023-04-02 LAB — COMPREHENSIVE METABOLIC PANEL WITH GFR: EGFR (Non-African Amer.): 91

## 2023-04-09 ENCOUNTER — Ambulatory Visit (INDEPENDENT_AMBULATORY_CARE_PROVIDER_SITE_OTHER): Payer: Medicare HMO

## 2023-04-09 ENCOUNTER — Ambulatory Visit: Payer: Medicare HMO | Admitting: Podiatry

## 2023-04-09 ENCOUNTER — Encounter: Payer: Self-pay | Admitting: Podiatry

## 2023-04-09 VITALS — Ht 72.0 in | Wt 172.0 lb

## 2023-04-09 DIAGNOSIS — M79672 Pain in left foot: Secondary | ICD-10-CM | POA: Diagnosis not present

## 2023-04-09 DIAGNOSIS — M79671 Pain in right foot: Secondary | ICD-10-CM | POA: Diagnosis not present

## 2023-04-09 DIAGNOSIS — M2141 Flat foot [pes planus] (acquired), right foot: Secondary | ICD-10-CM

## 2023-04-09 DIAGNOSIS — G629 Polyneuropathy, unspecified: Secondary | ICD-10-CM

## 2023-04-09 DIAGNOSIS — M2142 Flat foot [pes planus] (acquired), left foot: Secondary | ICD-10-CM | POA: Diagnosis not present

## 2023-04-09 DIAGNOSIS — I70223 Atherosclerosis of native arteries of extremities with rest pain, bilateral legs: Secondary | ICD-10-CM | POA: Diagnosis not present

## 2023-04-09 MED ORDER — LIDOCAINE-PRILOCAINE 2.5-2.5 % EX CREA: 1.0000 | TOPICAL_CREAM | CUTANEOUS | 2 refills | Status: DC | PRN

## 2023-04-09 NOTE — Patient Instructions (Signed)
 VISIT SUMMARY:  Today, we discussed your foot pain and toenail issues. You described pain in your big toes and the sides of your feet, as well as toenails falling off. We reviewed your history, including your previous B12 deficiency, back issues, and smoking habits. We have outlined a plan to address your symptoms and will follow up with necessary tests and treatments.  YOUR PLAN:  -FOOT PAIN AND NEUROPATHY: Neuropathy refers to nerve damage that can cause pain, tingling, or numbness. We will check your B12 levels with your primary care doctor and consider prescribing gabapentin if your circulation and B12 levels are normal. For now, use the prescribed lidocaine -prilocaine  cream and continue using Voltaren gel as needed for pain relief.  -PERIPHERAL VASCULAR DISEASE: Peripheral vascular disease is a condition where blood flow to your limbs is reduced. We are waiting for the results of your arterial and venous ultrasound studies. If these are abnormal, we will refer you to a vascular surgeon for further evaluation and management.  -PRE-ULCERATIVE LESION: A pre-ulcerative lesion is a sore that has not yet developed into an ulcer. Avoid cutting or shaving the lesion until we assess your circulation. Keep the area moisturized.  -ONYCHOLYSIS: Onycholysis is the separation of the nail from the nail bed. Maintain your nails by filing and trimming them to a comfortable level.  INSTRUCTIONS:  Please follow up with your primary care doctor to check your B12 levels. We will review the results of your arterial and venous ultrasound studies and consider a referral to a vascular surgeon if needed. If your circulation and B12 levels are normal, we may start you on gabapentin for neuropathy. Use the prescribed lidocaine -prilocaine  cream and Voltaren gel for pain relief. Continue to monitor and maintain your toenails and the pre-ulcerative lesion.

## 2023-04-09 NOTE — Progress Notes (Signed)
 Subjective:  Patient ID: Eric Diaz, male    DOB: 26-Aug-1945,  MRN: [id number]  Chief Complaint  Patient presents with   Foot Pain    Pt is here due to bilateral foot pain, states the pain has been there for over a month, states it hurts a lot especially at night hurts to put covers over his and also complains of toenails coming off.    Discussed the use of AI scribe software for clinical note transcription with the patient, who gave verbal consent to proceed.  History of Present Illness   Eric WANT "Eric Diaz" is a 78 year old male who presents with foot pain and toenail issues.  He experiences pain in his feet, particularly in his big toes and the sides of his feet, described as feeling like 'walking on rocks' with a burning or pins and needles sensation in his big toes. No cramping feelings in his feet or legs when walking.  He reports toenail issues, specifically toenails falling off, which he attributes to circulation problems. There is no recent infection reported that could explain the onycholysis.  He has a history of B12 deficiency, previously treated with B12 supplements until his levels became too high. Recent blood work was done with his regular doctor, but it is unclear if his B12 levels were checked.  He has a history of lower back issues, including spinal fusions, which he acknowledges could contribute to his symptoms. He denies any recent infections, except for a past episode of pneumonia, which was treated with antibiotics and steroids after a severe reaction. His foot symptoms worsened during this illness.  He smokes about half a pack of cigarettes per day. He denies being diabetic and has not been diagnosed with neuropathy.          Objective:    Physical Exam   EXTREMITIES: Feet show dependent rubor, cold temperature, and non palpable pulses. SKIN: Pre-ulcerative lesion on lateral fifth metatarsal head. Dystrophic nails with onycholysis.       No  images are attached to the encounter.    Results   RADIOLOGY Foot X-ray: No major abnormalities in bone structure  DIAGNOSTIC Venous Ultrasound: results unavailable directly      Assessment:   1. Pes planus of both feet   2. 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 Neuropathy      Plan:  Patient was evaluated and treated and all questions answered.  Assessment and Plan    Foot Pain and Neuropathy Pain in the big toes and sensation of walking on rocks. History of back fusion. No known diabetes or B12 deficiency. Recent pneumonia. Smoking history. -Check B12 level with primary care doctor. -Consider gabapentin for neuropathy if circulation is normal and B12 level is normal. -Topical lidocaine -prilocaine  cream prescribed for pain relief. -Continue Voltaren gel as needed.  Peripheral Vascular Disease Non-palpable pulses in feet, dependent rubor, cold temperature, and dystrophic nails with onycholysis. History of smoking. -Awaiting results of arterial and venous ultrasound studies. -If abnormal, referral to vascular surgeon for further evaluation and management.  Pre-ulcerative Lesion Lesion on lateral fifth metatarsal head. -Avoid cutting or shaving lesion until circulation is assessed. -Keep area moisturized.  Onycholysis Toenails falling off, possibly due to recent severe infection (pneumonia). -Maintain nails by filing and trimming to a comfortable level.  Follow-up -Check B12 level with primary care doctor. -Review results of arterial and venous ultrasound studies. -Consider referral to vascular surgeon if circulation is abnormal. -Consider  gabapentin for neuropathy if circulation is normal and B12 level is normal. -Use prescribed lidocaine -prilocaine  cream and Voltaren gel for pain relief. -Continue to monitor and maintain toenails and pre-ulcerative lesion.          Return if symptoms worsen or fail to improve.

## 2023-04-30 ENCOUNTER — Telehealth: Payer: Self-pay | Admitting: Internal Medicine

## 2023-04-30 ENCOUNTER — Encounter: Payer: Self-pay | Admitting: Podiatry

## 2023-04-30 DIAGNOSIS — I739 Peripheral vascular disease, unspecified: Secondary | ICD-10-CM | POA: Insufficient documentation

## 2023-04-30 NOTE — Telephone Encounter (Signed)
 Spoke with pt's wife, DPR who states pt's PCP ordered LE vascular ultrasound and has been advised he will need to see a vascular specialist.  Pt's wife states PCP office has left it up to the patient who he would like to see.  Pt's wife would like for pt to be seen in the [facility] system.   Provided pt's wife with [facility] telephone number of [phone].  Advised PCP may need to place referral if office does not accept self referrals.  Pt's wife verbalizes understanding and thanked Charity fundraiser for the call.

## 2023-04-30 NOTE — Telephone Encounter (Signed)
 Pt spouse called in stating has been having some issues with his legs and "arterial issues" . She asked if Dr. Graciela Husbands has any suggestions on provider or practices he can see. Please advise.

## 2023-05-04 NOTE — Progress Notes (Signed)
 Remote pacemaker transmission.

## 2023-05-10 ENCOUNTER — Other Ambulatory Visit: Payer: Self-pay | Admitting: *Deleted

## 2023-05-10 DIAGNOSIS — M79606 Pain in leg, unspecified: Secondary | ICD-10-CM

## 2023-05-14 ENCOUNTER — Ambulatory Visit (HOSPITAL_COMMUNITY)
Admission: RE | Admit: 2023-05-14 | Discharge: 2023-05-14 | Disposition: A | Discharge status: Home / Self Care | Source: Ambulatory Visit | Attending: Surgery | Admitting: Surgery

## 2023-05-14 DIAGNOSIS — M79606 Pain in leg, unspecified: Secondary | ICD-10-CM | POA: Diagnosis not present

## 2023-05-14 LAB — VAS US ABI WITH/WO TBI
Left ABI: 0.46
Right ABI: 0.38

## 2023-05-14 NOTE — Progress Notes (Unsigned)
 [facility] [place]  ASSESSMENT / PLAN: Eric Diaz is a 78 y.o. 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  Recommend:  Abstinence from all tobacco products. Blood glucose control with goal A1c < 7%. Blood pressure control with goal blood pressure < 140/90 mmHg. Lipid reduction therapy with goal LDL-C <100 mg/dL  Aspirin 81mg  PO QD.  Atorvastatin 40-80mg  PO QD (or other "high intensity" statin therapy).  Check CT angiogram of abdomen and pelvis with runoff to the toes as soon as possible.  I will call the patient with the results.  If the patient needs open reconstruction, we will return to the office to discuss risks, benefits, and alternatives.  He will need cardiology evaluation preoperatively.  If his stenting solution is appropriate, we will proceed without further testing.  CHIEF COMPLAINT: Severe pain in bilateral feet  HISTORY OF PRESENT ILLNESS: Eric Diaz is a 78 y.o. male referred to clinic for evaluation of severe pain in bilateral feet.  The patient reports symptoms that are fairly typical of ischemic rest pain.  He reports severe constant pain in the foot without relief.  The pain awakens him from sleep.  Dependency does not resolve the pain.  The patient has a small ulcer on the lateral aspect of the left foot about the metatarsophalangeal joint which appears to be nearly resolved.  He does describe very short distance claudication.  VASCULAR SURGICAL HISTORY: none  VASCULAR RISK FACTORS: Negative history of stroke / transient ischemic attack. Negative history of coronary artery disease.  Negative history of diabetes mellitus.  Positive history of smoking. + actively smoking. Positive history of hypertension.  Negative history of chronic kidney disease.  Positive history of chronic obstructive pulmonary disease.  Not on home oxygen.  FUNCTIONAL  STATUS: ECOG performance status: (2) Ambulatory and capable of self care, unable to carry out work activity, up and about > 50% or waking hours Ambulatory status: Minimally ambulatory (e.g. about the home only)  CAREY 1 AND 3 YEAR INDEX Male (2pts) 75-79 or 80-84 (2pts) >84 (3pts) Dependence in toileting (1pt) Partial or full dependence in dressing (1pt) History of malignant neoplasm (2pts) CHF (3pts) COPD (1pts) CKD (3pts)  0-3 pts 6% 1 year mortality ; 21% 3 year mortality 4-5 pts 12% 1 year mortality ; 36% 3 year mortality >5 pts 21% 1 year mortality; 54% 3 year mortality   Past Medical History:  Diagnosis Date   Acute heart failure (HCC)    AKI (acute kidney injury) (HCC) 02/15/2019   COPD (chronic obstructive pulmonary disease) (HCC)    High cholesterol    Hypertension    Sepsis (HCC) 02/15/2019    Past Surgical History:  Procedure Laterality Date   KNEE ARTHROSCOPY     left   LEAD REVISION/REPAIR N/A 03/25/2018   Procedure: LEAD REVISION/REPAIR;  Surgeon: Duke Salvia, MD;  Location: [facility] INVASIVE CV LAB;  Service: Cardiovascular;  Laterality: N/A;   PACEMAKER IMPLANT N/A 03/06/2018   Procedure: PACEMAKER IMPLANT;  Surgeon: Duke Salvia, MD;  Location: [facility] INVASIVE CV LAB;  Service: Cardiovascular;  Laterality: N/A;    History reviewed. No pertinent family history.  Social History   Socioeconomic History   Marital status: Married    Spouse name: Not on file   Number of children: Not on file   Years of education: Not on file   Highest education level: Not on file  Occupational  History   Not on file  Tobacco Use   Smoking status: Every Day    Current packs/day: 1.00    Average packs/day: 1 pack/day for 70.2 years (70.2 ttl pk-yrs)    Types: Cigarettes    Start date: 1955   Smokeless tobacco: Never  Vaping Use   Vaping status: Never Used  Substance and Sexual Activity   Alcohol use: Yes    Comment: occasionally   Drug use: No   Sexual activity: Not  on file  Other Topics Concern   Not on file  Social History Narrative   Not on file   Social Drivers of Health   Financial Resource Strain: Not on file  Food Insecurity: Not on file  Transportation Needs: Not on file  Physical Activity: Not on file  Stress: Not on file  Social Connections: Not on file  Intimate Partner Violence: Not on file    Allergies  Allergen Reactions   Bee Venom Anaphylaxis and Swelling   Amoxicillin-Pot Clavulanate Rash    Current Outpatient Medications  Medication Sig Dispense Refill   acetaminophen (TYLENOL) 500 MG tablet Take 1,000 mg by mouth 2 (two) times daily.     ACETAZOLAMIDE PO Take 500 mg by mouth every other day.     albuterol (VENTOLIN HFA) 108 (90 Base) MCG/ACT inhaler Inhale 1 puff into the lungs every 6 (six) hours as needed for wheezing or shortness of breath. Plz see PCP for additional refills. 6.7 g 11   Albuterol Sulfate 2.5 MG/0.5ML NEBU Inhale 0.5 mLs (2.5 mg total) into the lungs 2 (two) times daily as needed (shortness of breath). 20 mL 3   Ascorbic Acid (VITAMIN C) 1000 MG tablet Take 500 mg by mouth daily.     aspirin EC 81 MG tablet Take 1 tablet (81 mg total) by mouth daily with breakfast. 30 tablet 11   atorvastatin (LIPITOR) 40 MG tablet Take 40 mg by mouth at bedtime.     bisoprolol (ZEBETA) 5 MG tablet TAKE 1 TABLET (5 MG TOTAL) BY MOUTH DAILY. 90 tablet 3   Cholecalciferol (VITAMIN D3) 25 MCG (1000 UT) CAPS Take by mouth.     Fluticasone-Umeclidin-Vilant (TRELEGY ELLIPTA) 100-62.5-25 MCG/ACT AEPB One click each am 3 each 3   Multiple Vitamin (MULTIVITAMIN WITH MINERALS) TABS tablet Take 1 tablet by mouth every evening.     pantoprazole (PROTONIX) 40 MG tablet TAKE 1 TABLET (40 MG TOTAL) BY MOUTH DAILY. TAKE 30-60 MIN BEFORE FIRST MEAL OF THE DAY 90 tablet 3   predniSONE (DELTASONE) 5 MG tablet Take 1 tablet (5 mg total) by mouth daily with breakfast. 100 tablet 2   traMADol (ULTRAM) 50 MG tablet Take 50 mg by mouth every 6  (six) hours as needed.     zinc gluconate 50 MG tablet Take 50 mg by mouth every evening.     No current facility-administered medications for this visit.    PHYSICAL EXAM Vitals:   05/15/23 0909  BP: 131/67  Weight: 175 lb (79.4 kg)  Height: 6' (1.829 m)    Elderly man in no distress Regular rate and rhythm Unlabored breathing No palpable femoral pulses No palpable pedal pulses Feet are ruborous Capillary refill is reasonable in the feet 2 to 3 seconds. Small eschar over the left lateral foot over the fifth metatarsal pharyngeal joint  PERTINENT LABORATORY AND RADIOLOGIC DATA  Most recent CBC    Latest Ref Rng & Units 02/26/2023   12:25 PM 02/08/2022    3:08 PM  11/09/2020    9:08 AM  CBC  WBC 4.0 - 10.5 K/uL 19.0  15.2  13.7   Hemoglobin 13.0 - 17.0 g/dL 16.1  09.6  04.5   Hematocrit 39.0 - 52.0 % 41.2  43.5  35.2   Platelets 150 - 400 K/uL 308  255  212      Most recent CMP    Latest Ref Rng & Units 02/26/2023   12:25 PM 11/08/2020    5:44 AM 11/07/2020    2:06 PM  CMP  Glucose 70 - 99 mg/dL 409  811  914   BUN 8 - 23 mg/dL 15  15  15    Creatinine 0.61 - 1.24 mg/dL 7.82  9.56  2.13   Sodium 135 - 145 mmol/L 136  134  128   Potassium 3.5 - 5.1 mmol/L 3.5  4.5  3.8   Chloride 98 - 111 mmol/L 105  98  96   CO2 22 - 32 mmol/L 20  25  22    Calcium 8.9 - 10.3 mg/dL 9.2  9.3  8.5   Total Protein 6.5 - 8.1 g/dL 7.4  6.3    Total Bilirubin 0.0 - 1.2 mg/dL 0.9  0.9    Alkaline Phos 38 - 126 U/L 70  53    AST 15 - 41 U/L 22  23    ALT 0 - 44 U/L 24  30     +-------+-----------+-----------+------------+------------+  ABI/TBIToday's ABIToday's TBIPrevious ABIPrevious TBI  +-------+-----------+-----------+------------+------------+  Right 0.38       0.13                                 +-------+-----------+-----------+------------+------------+  Left  0.46       0.25                                  +-------+-----------+-----------+------------+------------+   Rande Brunt. Lenell Antu, MD FACS [facility] Phone Number: [phone] 05/15/2023 11:11 AM   Total time spent on preparing this encounter including chart review, data review, collecting history, examining the patient, coordinating care for this new patient, 60 minutes.  Portions of this report may have been transcribed using voice recognition software.  Every effort has been made to ensure accuracy; however, inadvertent computerized transcription errors may still be present.

## 2023-05-15 ENCOUNTER — Encounter: Payer: Self-pay | Admitting: Vascular Surgery

## 2023-05-15 ENCOUNTER — Ambulatory Visit: Admitting: Vascular Surgery

## 2023-05-15 VITALS — BP 131/67 | Ht 72.0 in | Wt 175.0 lb

## 2023-05-15 DIAGNOSIS — I7409 Other arterial embolism and thrombosis of abdominal aorta: Secondary | ICD-10-CM

## 2023-05-15 DIAGNOSIS — I70223 Atherosclerosis of native arteries of extremities with rest pain, bilateral legs: Secondary | ICD-10-CM | POA: Diagnosis not present

## 2023-05-16 ENCOUNTER — Other Ambulatory Visit: Payer: Self-pay

## 2023-05-16 DIAGNOSIS — I70223 Atherosclerosis of native arteries of extremities with rest pain, bilateral legs: Secondary | ICD-10-CM

## 2023-05-17 ENCOUNTER — Ambulatory Visit (HOSPITAL_COMMUNITY)
Admission: RE | Admit: 2023-05-17 | Discharge: 2023-05-17 | Disposition: A | Discharge status: Home / Self Care | Source: Ambulatory Visit | Attending: Vascular Surgery | Admitting: Vascular Surgery

## 2023-05-17 DIAGNOSIS — I70223 Atherosclerosis of native arteries of extremities with rest pain, bilateral legs: Secondary | ICD-10-CM | POA: Insufficient documentation

## 2023-05-17 MED ORDER — IOHEXOL 350 MG/ML SOLN
125.0000 mL | Freq: Once | INTRAVENOUS | Status: AC | PRN
  Administered 2023-05-17: 125 mL via INTRAVENOUS

## 2023-05-29 ENCOUNTER — Ambulatory Visit: Admitting: Vascular Surgery

## 2023-06-05 ENCOUNTER — Encounter: Payer: Self-pay | Admitting: Vascular Surgery

## 2023-06-05 ENCOUNTER — Ambulatory Visit: Admitting: Vascular Surgery

## 2023-06-05 VITALS — BP 147/72 | HR 70 | Temp 97.9°F | Ht 72.0 in | Wt 170.0 lb

## 2023-06-05 DIAGNOSIS — I70223 Atherosclerosis of native arteries of extremities with rest pain, bilateral legs: Secondary | ICD-10-CM

## 2023-06-05 DIAGNOSIS — I7409 Other arterial embolism and thrombosis of abdominal aorta: Secondary | ICD-10-CM

## 2023-06-05 NOTE — Progress Notes (Signed)
 [facility] [place]  ASSESSMENT / PLAN: Eric Diaz is a 78 y.o. 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  Recommend:  Abstinence from all tobacco products. Blood glucose control with goal A1c < 7%. Blood pressure control with goal blood pressure < 140/90 mmHg. Lipid reduction therapy with goal LDL-C <100 mg/dL  Aspirin 81mg  PO QD.  Atorvastatin 40-80mg  PO QD (or other "high intensity" statin therapy).  I had a long, detailed discussion with the patient and his wife. They are understandably nervous about vascular surgery. I counseled that intervention is recommended because of the limb threatening nature of these symptoms. We will ask his cardiology and pulmonology teams for preoperative risk stratification.  Will plan to right axilo-bi-femoral bypass after risk stratification.  CHIEF COMPLAINT: Severe pain in bilateral feet  HISTORY OF PRESENT ILLNESS: Eric Diaz is a 78 y.o. male referred to clinic for evaluation of severe pain in bilateral feet.  The patient reports symptoms that are fairly typical of ischemic rest pain.  He reports severe constant pain in the foot without relief.  The pain awakens him from sleep.  Dependency does not resolve the pain.  The patient has a small ulcer on the lateral aspect of the left foot about the metatarsophalangeal joint which appears to be nearly resolved.  He does describe very short distance claudication.  06/05/23: patient returns for discussion after CT angiogram. 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 His anatomy is most favorable for aortobifemoral bypass or axilo-bi-femoral bypass. The patient, his wife, and I had a detailed discussion about risks, benefits, and alternatives to surgical therapy. Unfortunately, he does need surgery to treat his pain. Minimally invasive  intervention is not likely to be successful because of extent of disease (I.e. TASC D occlusion)  VASCULAR SURGICAL HISTORY: none  VASCULAR RISK FACTORS: Negative history of stroke / transient ischemic attack. Negative history of coronary artery disease.  Negative history of diabetes mellitus.  Positive history of smoking. + actively smoking. Positive history of hypertension.  Negative history of chronic kidney disease.  Positive history of chronic obstructive pulmonary disease.  Not on home oxygen.  FUNCTIONAL STATUS: ECOG performance status: (2) Ambulatory and capable of self care, unable to carry out work activity, up and about > 50% or waking hours Ambulatory status: Minimally ambulatory (e.g. about the home only)  CAREY 1 AND 3 YEAR INDEX Male (2pts) 75-79 or 80-84 (2pts) >84 (3pts) Dependence in toileting (1pt) Partial or full dependence in dressing (1pt) History of malignant neoplasm (2pts) CHF (3pts) COPD (1pts) CKD (3pts)  0-3 pts 6% 1 year mortality ; 21% 3 year mortality 4-5 pts 12% 1 year mortality ; 36% 3 year mortality >5 pts 21% 1 year mortality; 54% 3 year mortality   Past Medical History:  Diagnosis Date   Acute heart failure (HCC)    AKI (acute kidney injury) (HCC) 02/15/2019   COPD (chronic obstructive pulmonary disease) (HCC)    High cholesterol    Hypertension    Sepsis (HCC) 02/15/2019    Past Surgical History:  Procedure Laterality Date   KNEE ARTHROSCOPY     left   LEAD REVISION/REPAIR N/A 03/25/2018   Procedure: LEAD REVISION/REPAIR;  Surgeon: Duke Salvia, MD;  Location: [facility] INVASIVE CV LAB;  Service: Cardiovascular;  Laterality: N/A;   PACEMAKER IMPLANT N/A 03/06/2018   Procedure: PACEMAKER IMPLANT;  Surgeon: Duke Salvia,  MD;  Location: [facility] INVASIVE CV LAB;  Service: Cardiovascular;  Laterality: N/A;    No family history on file.  Social History   Socioeconomic History   Marital status: Married    Spouse name: Not on file    Number of children: Not on file   Years of education: Not on file   Highest education level: Not on file  Occupational History   Not on file  Tobacco Use   Smoking status: Every Day    Current packs/day: 1.00    Average packs/day: 1 pack/day for 70.3 years (70.3 ttl pk-yrs)    Types: Cigarettes    Start date: 1955   Smokeless tobacco: Never  Vaping Use   Vaping status: Never Used  Substance and Sexual Activity   Alcohol use: Yes    Comment: occasionally   Drug use: No   Sexual activity: Not on file  Other Topics Concern   Not on file  Social History Narrative   Not on file   Social Drivers of Health   Financial Resource Strain: Not on file  Food Insecurity: Not on file  Transportation Needs: Not on file  Physical Activity: Not on file  Stress: Not on file  Social Connections: Not on file  Intimate Partner Violence: Not on file    Allergies  Allergen Reactions   Bee Venom Anaphylaxis and Swelling   Amoxicillin-Pot Clavulanate Rash    Current Outpatient Medications  Medication Sig Dispense Refill   acetaminophen (TYLENOL) 500 MG tablet Take 1,000 mg by mouth 2 (two) times daily.     ACETAZOLAMIDE PO Take 500 mg by mouth every other day.     albuterol (VENTOLIN HFA) 108 (90 Base) MCG/ACT inhaler Inhale 1 puff into the lungs every 6 (six) hours as needed for wheezing or shortness of breath. Plz see PCP for additional refills. 6.7 g 11   Albuterol Sulfate 2.5 MG/0.5ML NEBU Inhale 0.5 mLs (2.5 mg total) into the lungs 2 (two) times daily as needed (shortness of breath). 20 mL 3   Ascorbic Acid (VITAMIN C) 1000 MG tablet Take 500 mg by mouth daily.     aspirin EC 81 MG tablet Take 1 tablet (81 mg total) by mouth daily with breakfast. 30 tablet 11   atorvastatin (LIPITOR) 40 MG tablet Take 40 mg by mouth at bedtime.     bisoprolol (ZEBETA) 5 MG tablet TAKE 1 TABLET (5 MG TOTAL) BY MOUTH DAILY. 90 tablet 3   Cholecalciferol (VITAMIN D3) 25 MCG (1000 UT) CAPS Take by mouth.      Fluticasone-Umeclidin-Vilant (TRELEGY ELLIPTA) 100-62.5-25 MCG/ACT AEPB One click each am 3 each 3   Multiple Vitamin (MULTIVITAMIN WITH MINERALS) TABS tablet Take 1 tablet by mouth every evening.     pantoprazole (PROTONIX) 40 MG tablet TAKE 1 TABLET (40 MG TOTAL) BY MOUTH DAILY. TAKE 30-60 MIN BEFORE FIRST MEAL OF THE DAY 90 tablet 3   predniSONE (DELTASONE) 5 MG tablet Take 1 tablet (5 mg total) by mouth daily with breakfast. 100 tablet 2   traMADol (ULTRAM) 50 MG tablet Take 50 mg by mouth every 6 (six) hours as needed.     zinc gluconate 50 MG tablet Take 50 mg by mouth every evening.     No current facility-administered medications for this visit.    PHYSICAL EXAM There were no vitals filed for this visit.   Elderly man in no distress Regular rate and rhythm Unlabored breathing 2+ radial pulses bilaterally No palpable femoral pulses No  palpable pedal pulses Feet are ruborous Capillary refill is reasonable in the feet 2 to 3 seconds. Small eschar over the left lateral foot over the fifth metatarsal pharyngeal joint  PERTINENT LABORATORY AND RADIOLOGIC DATA  Most recent CBC    Latest Ref Rng & Units 02/26/2023   12:25 PM 02/08/2022    3:08 PM 11/09/2020    9:08 AM  CBC  WBC 4.0 - 10.5 K/uL 19.0  15.2  13.7   Hemoglobin 13.0 - 17.0 g/dL 84.6  96.2  95.2   Hematocrit 39.0 - 52.0 % 41.2  43.5  35.2   Platelets 150 - 400 K/uL 308  255  212      Most recent CMP    Latest Ref Rng & Units 02/26/2023   12:25 PM 11/08/2020    5:44 AM 11/07/2020    2:06 PM  CMP  Glucose 70 - 99 mg/dL 841  324  401   BUN 8 - 23 mg/dL 15  15  15    Creatinine 0.61 - 1.24 mg/dL 0.27  2.53  6.64   Sodium 135 - 145 mmol/L 136  134  128   Potassium 3.5 - 5.1 mmol/L 3.5  4.5  3.8   Chloride 98 - 111 mmol/L 105  98  96   CO2 22 - 32 mmol/L 20  25  22    Calcium 8.9 - 10.3 mg/dL 9.2  9.3  8.5   Total Protein 6.5 - 8.1 g/dL 7.4  6.3    Total Bilirubin 0.0 - 1.2 mg/dL 0.9  0.9    Alkaline Phos 38  - 126 U/L 70  53    AST 15 - 41 U/L 22  23    ALT 0 - 44 U/L 24  30     +-------+-----------+-----------+------------+------------+  ABI/TBIToday's ABIToday's TBIPrevious ABIPrevious TBI  +-------+-----------+-----------+------------+------------+  Right 0.38       0.13                                 +-------+-----------+-----------+------------+------------+  Left  0.46       0.25                                 +-------+-----------+-----------+------------+------------+   Rande Brunt. Lenell Antu, MD FACS [facility] Phone Number: [phone] 06/05/2023 8:05 AM   Total time spent on preparing this encounter including chart review, data review, collecting history, examining the patient, coordinating care for this established patient, 40 minutes.  Portions of this report may have been transcribed using voice recognition software.  Every effort has been made to ensure accuracy; however, inadvertent computerized transcription errors may still be present.

## 2023-06-06 ENCOUNTER — Telehealth: Payer: Self-pay

## 2023-06-06 NOTE — Telephone Encounter (Signed)
   Pre-operative Risk Assessment    Patient Name: Eric Diaz  DOB: Apr 01, 1945 MRN: [id number]   Date of last office visit: 02/08/22 WITH DR. Graciela Husbands Date of next office visit: NONE   Request for Surgical Clearance    Procedure:   AX BI FEM  Date of Surgery:  Clearance TBD                                Surgeon:  DR. Lenell Antu Surgeon's Group or Practice Name:  [facility]  Phone number:  [phone] Fax number:  [phone]   Type of Clearance Requested:   - Medical  - Pharmacy:  Hold Aspirin NEEDS INSTRUCTIONS WHEN TO HOLD    Type of Anesthesia:  General    Additional requests/questions:    SignedMichaelle Copas   06/06/2023, 3:29 PM

## 2023-06-06 NOTE — Telephone Encounter (Signed)
 Sent message to schedulers to schedule for patient for preop visit

## 2023-06-06 NOTE — Telephone Encounter (Signed)
    Primary Cardiologist: Dr. Graciela Husbands  Chart reviewed as part of pre-operative protocol coverage. Because of Eric Diaz's past medical history and time since last visit, he/she will require a follow-up visit in order to better assess preoperative cardiovascular risk.  Pre-op covering staff: - Please schedule in office appointment and call patient to inform them. - Please contact requesting surgeon's office via preferred method (i.e, phone, fax) to inform them of need for appointment prior to surgery.  If applicable, this message will also be routed to pharmacy pool and/or primary cardiologist for input on holding anticoagulant/antiplatelet agent as requested below so that this information is available at time of patient's appointment.   Ronney Asters, NP  06/06/2023, 3:37 PM

## 2023-06-07 ENCOUNTER — Encounter: Payer: Self-pay | Admitting: Internal Medicine

## 2023-06-07 ENCOUNTER — Ambulatory Visit: Admitting: Internal Medicine

## 2023-06-07 VITALS — BP 117/69 | HR 80 | Ht 72.0 in | Wt 170.2 lb

## 2023-06-07 DIAGNOSIS — F17210 Nicotine dependence, cigarettes, uncomplicated: Secondary | ICD-10-CM | POA: Diagnosis not present

## 2023-06-07 DIAGNOSIS — J449 Chronic obstructive pulmonary disease, unspecified: Secondary | ICD-10-CM | POA: Diagnosis not present

## 2023-06-07 NOTE — Progress Notes (Unsigned)
 Eric Diaz, male    DOB: 12/23/1945      MRN: [id number]   Brief patient profile:  53 yowm MM/ active smoker onset sob around 2018 on prn saba then trelegy started around 2019 and steroid dep at 10 mg /day with 3 flares since May 2022 while in [place] referred to pulmonary clinic in [place]  09/10/2020 by EDP     History of Present Illness  09/10/2020  Pulmonary/ 1st office eval/ Eric Diaz / [place] Office  Chief Complaint  Patient presents with   Pulmonary Consult    Referred by [facility]- pt with COPD and frequent flare ups since June 2022.  He states wheezing some today.   Dyspnea:  worse just today finished pred 4 days prior to OV   Cough: better slt yellowish  Sleep: better p last ER / flat bed with pillow SABA use: neb 4 h prior to OV  02 none  Rec I recommend you get off the coreg  and the substitute is called bisoprolol 5 mg one daily  Plan A = Automatic = Always=    Trelegy 100 one click  first thing each am  Prednisone 10 mg daily - take 2 daily if not doing great  Pantoprazole (protonix) 40 mg   Take  30-60 min before first meal of the day and Pepcid (famotidine)  20 mg after supper until return to office - this is the best way to tell whether stomach acid is contributing to your problem.   Plan B = Backup (to supplement plan A, not to replace it) Only use your albuterol inhaler as a rescue medication Plan C = Crisis (instead of Plan B but only if Plan B stops working) - only use your albuterol nebulizer if you first try Plan B and it fails to help > ok to use the nebulizer up to every 4 hours but if start needing it regularly call for immediate appointment GERD diet reviewed, bed blocks rec  Please schedule a follow up office visit in 4 weeks, sooner if needed  with all medications /inhalers/ solutions in hand so we can verify exactly what you are taking. This includes all medications from all doctors and over the counters    10/06/2020  f/u ov/[place] office/Eric Diaz re:  atopic / MM/ maint on trelegy  / pred 20 mg one half daily  Chief Complaint  Patient presents with   Follow-up    Breathing overall doing well- not as well as it was before he had seizure approx 1 wk ago. He has used his albuterol inhaler once in the past wk and it helped.    Dyspnea:  extremely sedentary / walks with cane due to balance  Cough: minimal  Sleeping: 45 degrees recliner due to back  SABA use: as above  02: none  Covid status: x 3  Lung cancer screening: advised  Rec Allergy eval > neg skin testing 11/24/20    02/07/2021  f/u ov/[name] office/Eric Diaz re: ?ACOS  maint on trelegy / prednisone 10 mg one half  daily  Chief Complaint  Patient presents with   Follow-up    Breathing and wheezing have improved since last OV.   Hosp stay for pneumonia in September 2022.    Dyspnea:  more mobile with rollator/ limited by balance not sob  Cough: minimal  Sleeping: now able to lie flat one pillow SABA use: rare hfa  rare neb  02: none  - sats run 92% walking when he checks them  Lung cancer screening: due 10/2018 Rec  No change in medications      09/29/2021  f/u ov/[name] office/Eric Diaz re: GOLD 3  maint on Trelegy and pred 20 mg daily  with lowest 5 mg x 3 y Chief Complaint  Patient presents with   Follow-up    Doing well since last ov   Dyspnea:  500 ft to shed flat / did not need to stop/ also limited by back/leg pains even when breathing ok   Cough: minimal cough Sleeping: flat bed / one pillow  SABA use: alb hfa every 2-3 days  02: none  Covid status:  vax x 3  Lung cancer screening: advised for Sept 2023  Rec For nasal symptoms see Dr Eric Diaz > not seen  [facility] to try albuterol 15 min before an activity (on alternating days)  that you know would usually make you short of breath     12/19/2022  f/u ov/[name] office/Eric Diaz re: GOLD 3 copd  maint on trelegy and prednisone 10 mg  - breathing much worse of 5 mg Chief Complaint  Patient presents with   COPD     Gold  Dyspnea:  shop and back about the same  Cough: first thing in am clear Sleeping: flat bed one pillow s resp cc  SABA use: once a day / rare - just with aecopd 02: none  Lung cancer screening: ordered today  Rec  For cough/ congestion > mucinex or mucinex dm  up to maximum of  1200 mg every 12 hours as needed  Start Ohtuvayre nebulizers twice daily  Prednisone should be able to taper to 10 mg - 5 mg - 10 mg    LDSCT  11/18/ 24 still pending at time of ov  01/29/2023    01/29/2023  6 week f/u ov/[name] office/Eric Diaz re: GOLD 3  maint on trelegy 100 and pred 12-01-08   Dyspnea:  shop and back is flat is about sev hundred feet  Cough: minimal x 2-3 coughs > mucoid  Sleeping: flat bed / one pillow s noct    resp cc  SABA use: hfa rarely / ony using neb with aecopd related to uri's 02: none  Lung cancer screening: in process  Rec Try prednisone 5 mg daily if tolerated - if not resume 10-5- 10 as before (alternating days) Please schedule a follow up visit in 6 months but call sooner if needed      06/07/2023  f/u ov/[place] office/Eric Diaz re: GOLD 3 COPD  maint on trelegy / pred 5 mg daily  here for clearance ax bilfem  for claudication / wife doing Microbiologist and is Sports administrator Complaint  Patient presents with   Surgical Clearance   Dyspnea:  limited by feet hurting and some calf pain/ shop is several hundred feet slow and stops half way  Cough: minimal but mostly in am mucoid  Sleeping: on couch to elevate feet on arm s  resp cc  SABA use: saba hfa couple times a week 02: none   Lung cancer screening: ***   No obvious day to day or daytime variability or assoc excess/ purulent sputum or mucus plugs or hemoptysis or cp or chest tightness, subjective wheeze or overt sinus or hb symptoms.    Also denies any obvious fluctuation of symptoms with weather or environmental changes or other aggravating or alleviating factors except as outlined above   No unusual exposure  hx or h/o childhood pna/ asthma or knowledge of premature birth.  Current Allergies, Complete Past Medical History, Past Surgical History, Family History, and Social History were reviewed in [name].  ROS  The following are not active complaints unless bolded Hoarseness, sore throat, dysphagia, dental problems, itching, sneezing,  nasal congestion or discharge of excess mucus or purulent secretions, ear ache,   fever, chills, sweats, unintended wt loss or wt gain, classically pleuritic or exertional cp,  orthopnea pnd or arm/hand swelling  or leg swelling, presyncope, palpitations, abdominal pain, anorexia, nausea, vomiting, diarrhea  or change in bowel habits or change in bladder habits, change in stools or change in urine, dysuria, hematuria,  rash, arthralgias, visual complaints, headache, numbness, weakness or ataxia or problems with walking or coordination,  change in mood or  memory. Feet hurt 24/7 = bilaterally        Current Meds  Medication Sig   acetaminophen (TYLENOL) 500 MG tablet Take 1,000 mg by mouth 2 (two) times daily.   ACETAZOLAMIDE PO Take 500 mg by mouth every other day.   albuterol (VENTOLIN HFA) 108 (90 Base) MCG/ACT inhaler Inhale 1 puff into the lungs every 6 (six) hours as needed for wheezing or shortness of breath. Plz see PCP for additional refills.   Albuterol Sulfate 2.5 MG/0.5ML NEBU Inhale 0.5 mLs (2.5 mg total) into the lungs 2 (two) times daily as needed (shortness of breath).   Ascorbic Acid (VITAMIN C) 1000 MG tablet Take 500 mg by mouth daily.   aspirin EC 81 MG tablet Take 1 tablet (81 mg total) by mouth daily with breakfast.   atorvastatin (LIPITOR) 40 MG tablet Take 40 mg by mouth at bedtime.   bisoprolol (ZEBETA) 5 MG tablet TAKE 1 TABLET (5 MG TOTAL) BY MOUTH DAILY.   Cholecalciferol (VITAMIN D3) 25 MCG (1000 UT) CAPS Take by mouth.   Fluticasone-Umeclidin-Vilant (TRELEGY ELLIPTA) 100-62.5-25 MCG/ACT AEPB One click each am    Multiple Vitamin (MULTIVITAMIN WITH MINERALS) TABS tablet Take 1 tablet by mouth every evening.   pantoprazole (PROTONIX) 40 MG tablet TAKE 1 TABLET (40 MG TOTAL) BY MOUTH DAILY. TAKE 30-60 MIN BEFORE FIRST MEAL OF THE DAY   predniSONE (DELTASONE) 5 MG tablet Take 1 tablet (5 mg total) by mouth daily with breakfast.   traMADol (ULTRAM) 50 MG tablet Take 50 mg by mouth every 6 (six) hours as needed.   zinc gluconate 50 MG tablet Take 50 mg by mouth every evening.           Past Medical History:  Diagnosis Date   COPD (chronic obstructive pulmonary disease) (HCC)    High cholesterol    Hypertension     Objective:    Wts  06/07/2023        170  01/29/2023        176  12/19/2022      175   09/29/2021         186  02/07/2021     180  10/06/20 194 lb (88 kg)  09/10/20 187 lb (84.8 kg)  08/22/20 192 lb (87.1 kg)    Vital signs reviewed  06/07/2023  - Note at rest 02 sats  97% on R   General appearance:    amb pleasant wm nad / clear lungs    Mod bar ***            Assessment

## 2023-06-07 NOTE — Telephone Encounter (Signed)
 Spoke w/ patients wife, Claris Gower - scheduled patient for pre-op clearance with Canary Brim. NP on 4/28.

## 2023-06-07 NOTE — Telephone Encounter (Signed)
 I will update all parties involved pt has appt 06/25/23 with Canary Brim, NP.

## 2023-06-07 NOTE — Patient Instructions (Addendum)
 You really need to quit smoking at least 2 weeks preop   You are cleared for surgery.  Please Re- schedule a follow up visit  for 6 months but call sooner if needed

## 2023-06-08 NOTE — Assessment & Plan Note (Addendum)
 Referred for annual lung cancer screeing due q September 09/29/2021   - - not able to cooperate for LDSCT  01/15/23 but leave in program for now   3-5 min discussion re active cigarette smoking in addition to office E&M  Ask about tobacco use:   ongoing  Advise quitting   I took an extended  opportunity with this patient to outline the consequences of continued cigarette use  in airway disorders based on all the data we have from the multiple national lung health studies (perfomed over decades at millions of dollars in cost)  indicating that smoking cessation, not choice of inhalers or pulmonary physicians, is the most important aspect of his  care.   Assess willingness:  Not committed at this point Assist in quit attempt:  Per PCP when ready Arrange follow up:   Follow up per Primary Care planned

## 2023-06-08 NOTE — Assessment & Plan Note (Signed)
 Active smoker / pred dependent per PCP since around 2020 - PFT's  11/17/19 FEV1 1.15 (39 % ) ratio 0.45  p 2  % improvement from saba p ? 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  - LDSCT  01/05/23 moderate centrilobular and paraseptal emphysema.   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    I doubt is foot pain is all related to claudication whereas his calf pain surely is and he desperately wants the surgery even if it only helps his calf pain so I cleared him for surgery contingent on stopping smoking x 2 weeks preop and hope to extend this post op as well.          Each maintenance medication was reviewed in detail including emphasizing most importantly the difference between maintenance and prns and under what circumstances the prns are to be triggered using an action plan format where appropriate.  Total time for H and P, chart review, counseling, reviewing hfa/dpi device(s) and generating customized AVS unique to this office visit / same day charting = 30 min

## 2023-06-19 NOTE — Progress Notes (Signed)
 Electrophysiology Office Note:   Date:  06/25/2023  ID:  Eric Diaz, DOB 04-Oct-1945, MRN [id number]  Primary Cardiologist: None Primary Heart Failure: None Electrophysiologist: Eric Chandler, MD       History of Present Illness:   Eric Diaz is a 78 y.o. male with h/o CHB s/p dual chamber PPM, HTN, HLD, O2 dependent COPD, recurrent falls seen today for routine electrophysiology followup & pre-operative clearance.   Since last being seen in our clinic the patient reports he is looking at having an axillary-bi-femoral bypass. His wife relays significant concerns over having the procedure completed. She is worried he will not do well. She reports an "episode" that occurred yesterday where he was sitting on the couch and he was trying to get a soda lid on a drink and he tried multiple times, his arm had a tremor, he was starring out into space, then was folding a tissue over and over". She states she thinks he has had TIA's in the past and  has seen Neurology. Episode occurred after taking his nebulized bronchodilator.    he denies chest pain, palpitations, dyspnea, PND, orthopnea, nausea, vomiting, dizziness, syncope, edema, weight gain, or early satiety.   Review of systems complete and found to be negative unless listed in HPI.    EP Information / Studies Reviewed:    EKG is ordered today. Personal review as below.  EKG Interpretation Date/Time:  Monday June 25 2023 14:33:06 EDT Ventricular Rate:  83 PR Interval:  208 QRS Duration:  148 QT Interval:  422 QTC Calculation: 495 R Axis:   -82  Text Interpretation: Atrial-sensed ventricular-paced rhythm Confirmed by Eric Diaz ([id number]) on 06/25/2023 3:03:12 PM   PPM Interrogation-  reviewed in detail today,  See PACEART report.  Device History: Abbott Dual Chamber PPM implanted 03/25/2018 for CHB (initial implant 03/06/2018, he developed high ventricular pacing thresholds and required revision 2 weeks later)  Studies:   Gated Myoview  w/Lexiscan  04/2019 > normal / low risk study, LVEF 55-60% ECHO 10/2020 > LVEF 55-60%, mild MV stenosis  Arrhythmia / AAD CHB           Physical Exam:   VS:  BP (!) 122/52 (BP Location: Left Arm, Patient Position: Sitting, Cuff Size: Normal)   Pulse 83   Ht 5\' 11"  (1.803 m)   Wt 170 lb (77.1 kg)   BMI 23.71 kg/m    Wt Readings from Last 3 Encounters:  06/25/23 170 lb (77.1 kg)  06/07/23 170 lb 4 oz (77.2 kg)  06/05/23 170 lb (77.1 kg)     GEN: Well nourished, well developed in no acute distress NECK: No JVD; No carotid bruits CARDIAC: Regular rate and rhythm, no murmurs, rubs, gallops RESPIRATORY:  Clear to auscultation without rales, wheezing or rhonchi  ABDOMEN: Soft, non-tender, non-distended EXTREMITIES:  No edema; No deformity   ASSESSMENT AND PLAN:    CHB s/p Abbott PPM  Hx Syncope, Orthostatic Falls  -Normal PPM function -See Pace Art report -No changes today -largely dependent on interrogation, has brief episodes of conduction but VIP turned on and he failed so turned it back off (RV pacing 92%)  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 Edgardo Diaz Surgeon's Group or Practice Name:  [facility]  Phone number:  [phone] Fax number:  [phone] Type of Clearance Requested:   - Medical  - Pharmacy:  [facility] hold per [facility]   Type of Anesthesia:  General   Click Here to Calculate RCRI      :[id number]}   Eric Diaz's perioperative risk of a major cardiac event is at least 11% according to the Revised Cardiac Risk Index (RCRI). Therefore, he is at high risk for perioperative complications. His functional capacity is fair at 5.72 METs according to the Duke Activity Status Index (DASI).  Recommendations: Recommend repeat Gated Myoview  with Lexiscan   given overall risk and no recent EF / coronary assessment before proceeding with surgery.   Informed Consent   Shared Decision Making/Informed Consent The risks [chest pain, shortness of breath, cardiac arrhythmias, dizziness, blood pressure fluctuations, myocardial infarction, stroke/transient ischemic attack, nausea, vomiting, allergic reaction, radiation exposure, metallic taste sensation and life-threatening complications (estimated to be 1 in 10,000)], benefits (risk stratification, diagnosing coronary artery disease, treatment guidance) and alternatives of a nuclear stress test were discussed in detail with Eric Diaz and he agrees to proceed.      Antiplatelet and/or Anticoagulation Recommendations: Aspirin  can be held for 7 days prior to his surgery.  Please resume Aspirin  post operatively when it is felt to be safe from a bleeding standpoint.     Disposition:   Follow up with Dr. Rodolfo Diaz in 12 months  Signed, Eric Doffing, NP-C, AGACNP-BC [facility] - Electrophysiology  06/25/2023, 5:36 PM

## 2023-06-25 ENCOUNTER — Encounter: Payer: Self-pay | Admitting: Pulmonary Disease

## 2023-06-25 ENCOUNTER — Ambulatory Visit: Attending: Pulmonary Disease | Admitting: Pulmonary Disease

## 2023-06-25 ENCOUNTER — Encounter: Payer: Self-pay | Admitting: *Deleted

## 2023-06-25 ENCOUNTER — Ambulatory Visit: Payer: Medicare HMO | Admitting: Internal Medicine

## 2023-06-25 ENCOUNTER — Ambulatory Visit (INDEPENDENT_AMBULATORY_CARE_PROVIDER_SITE_OTHER): Payer: Medicare HMO

## 2023-06-25 VITALS — BP 122/52 | HR 83 | Ht 71.0 in | Wt 170.0 lb

## 2023-06-25 DIAGNOSIS — I442 Atrioventricular block, complete: Secondary | ICD-10-CM

## 2023-06-25 DIAGNOSIS — Z01818 Encounter for other preprocedural examination: Secondary | ICD-10-CM

## 2023-06-25 DIAGNOSIS — Z950 Presence of cardiac pacemaker: Secondary | ICD-10-CM | POA: Diagnosis not present

## 2023-06-25 LAB — CUP PACEART REMOTE DEVICE CHECK
Battery Remaining Longevity: 61 mo
Battery Remaining Percentage: 48 %
Battery Voltage: 2.99 V
Brady Statistic AP VP Percent: 1 %
Brady Statistic AP VS Percent: 1 %
Brady Statistic AS VP Percent: 91 %
Brady Statistic AS VS Percent: 7.8 %
Brady Statistic RA Percent Paced: 1 %
Brady Statistic RV Percent Paced: 92 %
Date Time Interrogation Session: 20250428031237
Implantable Lead Connection Status: 753985
Implantable Lead Connection Status: 753985
Implantable Lead Implant Date: 20200108
Implantable Lead Implant Date: 20200108
Implantable Lead Location: 753859
Implantable Lead Location: 753860
Implantable Lead Model: 5076
Implantable Lead Model: 5076
Implantable Pulse Generator Implant Date: 20200108
Lead Channel Impedance Value: 330 Ohm
Lead Channel Impedance Value: 830 Ohm
Lead Channel Pacing Threshold Amplitude: 0.5 V
Lead Channel Pacing Threshold Amplitude: 0.75 V
Lead Channel Pacing Threshold Pulse Width: 0.4 ms
Lead Channel Pacing Threshold Pulse Width: 0.5 ms
Lead Channel Sensing Intrinsic Amplitude: 3.2 mV
Lead Channel Sensing Intrinsic Amplitude: 6.3 mV
Lead Channel Setting Pacing Amplitude: 0.75 V
Lead Channel Setting Pacing Amplitude: 2 V
Lead Channel Setting Pacing Pulse Width: 0.4 ms
Lead Channel Setting Sensing Sensitivity: 3 mV
Pulse Gen Model: 2272
Pulse Gen Serial Number: 9091999

## 2023-06-25 LAB — CUP PACEART INCLINIC DEVICE CHECK
Date Time Interrogation Session: 20250428171445
Implantable Lead Connection Status: 753985
Implantable Lead Connection Status: 753985
Implantable Lead Implant Date: 20200108
Implantable Lead Implant Date: 20200108
Implantable Lead Location: 753859
Implantable Lead Location: 753860
Implantable Lead Model: 5076
Implantable Lead Model: 5076
Implantable Pulse Generator Implant Date: 20200108
Pulse Gen Model: 2272
Pulse Gen Serial Number: 9091999

## 2023-06-25 NOTE — Patient Instructions (Signed)
 Medication Instructions:  Your physician recommends that you continue on your current medications as directed. Please refer to the Current Medication list given to you today.  *If you need a refill on your cardiac medications before your next appointment, please call your pharmacy*  Lab Work: BMET, CBC-TODAY If you have labs (blood work) drawn today and your tests are completely normal, you will receive your results only by: MyChart Message (if you have MyChart) OR A paper copy in the mail If you have any lab test that is abnormal or we need to change your treatment, we will call you to review the results.  Testing/Procedures: Your physician has requested that you have a lexiscan  myoview . For further information please visit [email or web address] Please follow instruction sheet, as given.   Follow-Up: At [facility], you and your health needs are our priority.  As part of our continuing mission to provide you with exceptional heart care, our providers are all part of one team.  This team includes your primary Cardiologist (physician) and Advanced Practice Providers or APPs (Physician Assistants and Nurse Practitioners) who all work together to provide you with the care you need, when you need it.  Your next appointment:   1 year(s)  Provider:   You will see one of the following Advanced Practice Providers on your designated Care Team:   Mertha Abrahams, Kennard Pea [address]" [place], PA-C Suzann Riddle, NP Creighton Doffing, NP

## 2023-06-26 LAB — BASIC METABOLIC PANEL WITH GFR
BUN/Creatinine Ratio: 16 (ref 10–24)
BUN: 13 mg/dL (ref 8–27)
CO2: 20 mmol/L (ref 20–29)
Calcium: 9.6 mg/dL (ref 8.6–10.2)
Chloride: 106 mmol/L (ref 96–106)
Creatinine, Ser: 0.81 mg/dL (ref 0.76–1.27)
Glucose: 97 mg/dL (ref 70–99)
Potassium: 4.8 mmol/L (ref 3.5–5.2)
Sodium: 142 mmol/L (ref 134–144)
eGFR: 91 mL/min/{1.73_m2} (ref >59)

## 2023-06-26 LAB — CBC
Hematocrit: 43.7 % (ref 37.5–51.0)
Hemoglobin: 14.3 g/dL (ref 13.0–17.7)
MCH: 32.2 pg (ref 26.6–33.0)
MCHC: 32.7 g/dL (ref 31.5–35.7)
MCV: 98 fL — ABNORMAL HIGH (ref 79–97)
Platelets: 264 10*3/uL (ref 150–450)
RBC: 4.44 x10E6/uL (ref 4.14–5.80)
RDW: 11.7 % (ref 11.6–15.4)
WBC: 23.3 10*3/uL (ref 3.4–10.8)

## 2023-07-10 ENCOUNTER — Encounter (HOSPITAL_COMMUNITY): Payer: Self-pay

## 2023-07-13 ENCOUNTER — Other Ambulatory Visit: Payer: Self-pay | Admitting: Pulmonary Disease

## 2023-07-13 ENCOUNTER — Telehealth (HOSPITAL_COMMUNITY): Payer: Self-pay | Admitting: *Deleted

## 2023-07-13 DIAGNOSIS — Z01818 Encounter for other preprocedural examination: Secondary | ICD-10-CM

## 2023-07-13 NOTE — Telephone Encounter (Signed)
 Left detailed instructions for MPI study.

## 2023-07-17 ENCOUNTER — Telehealth (HOSPITAL_COMMUNITY): Payer: Self-pay | Admitting: Pulmonary Disease

## 2023-07-17 ENCOUNTER — Telehealth: Payer: Self-pay

## 2023-07-17 NOTE — Telephone Encounter (Signed)
 Patient cancelled Myoview  for reason below:  07/17/2023 10:33 AM By: DUNN, OLIVIA B  Cancel Rsn: Patient (Patient would like to cancel and not reschedule. Pt has decided not to have the surgery therefore pt said provider was ok with canceling the test for surgical clearance.)   Order will be removed from the [facility]. Thank you.

## 2023-07-17 NOTE — Telephone Encounter (Signed)
 Attempted to call for clarification from recent note from cardiology stating that patient had cancelled testing.  Per telephone note from cardiology, patient had stated that he has "chosen to cancel surgery."  LVM for patient.

## 2023-07-19 ENCOUNTER — Ambulatory Visit (HOSPITAL_COMMUNITY)

## 2023-08-16 NOTE — Progress Notes (Signed)
 Remote pacemaker transmission.

## 2023-08-28 ENCOUNTER — Other Ambulatory Visit: Payer: Self-pay | Admitting: Internal Medicine

## 2023-09-12 ENCOUNTER — Other Ambulatory Visit: Payer: Self-pay | Admitting: Internal Medicine

## 2023-09-24 ENCOUNTER — Ambulatory Visit (INDEPENDENT_AMBULATORY_CARE_PROVIDER_SITE_OTHER): Payer: Medicare HMO

## 2023-09-24 DIAGNOSIS — I442 Atrioventricular block, complete: Secondary | ICD-10-CM | POA: Diagnosis not present

## 2023-09-25 LAB — CUP PACEART REMOTE DEVICE CHECK
Battery Remaining Longevity: 59 mo
Battery Remaining Percentage: 46 %
Battery Voltage: 2.99 V
Brady Statistic AP VP Percent: 3.7 %
Brady Statistic AP VS Percent: 1 %
Brady Statistic AS VP Percent: 91 %
Brady Statistic AS VS Percent: 2.7 %
Brady Statistic RA Percent Paced: 1.3 %
Brady Statistic RV Percent Paced: 95 %
Date Time Interrogation Session: 20250729015015
Implantable Lead Connection Status: 753985
Implantable Lead Connection Status: 753985
Implantable Lead Implant Date: 20200108
Implantable Lead Implant Date: 20200108
Implantable Lead Location: 753859
Implantable Lead Location: 753860
Implantable Lead Model: 5076
Implantable Lead Model: 5076
Implantable Pulse Generator Implant Date: 20200108
Lead Channel Impedance Value: 360 Ohm
Lead Channel Impedance Value: 840 Ohm
Lead Channel Pacing Threshold Amplitude: 0.5 V
Lead Channel Pacing Threshold Amplitude: 0.75 V
Lead Channel Pacing Threshold Pulse Width: 0.4 ms
Lead Channel Pacing Threshold Pulse Width: 0.5 ms
Lead Channel Sensing Intrinsic Amplitude: 3.3 mV
Lead Channel Sensing Intrinsic Amplitude: 5.4 mV
Lead Channel Setting Pacing Amplitude: 0.75 V
Lead Channel Setting Pacing Amplitude: 2 V
Lead Channel Setting Pacing Pulse Width: 0.4 ms
Lead Channel Setting Sensing Sensitivity: 0.5 mV
Pulse Gen Model: 2272
Pulse Gen Serial Number: 9091999

## 2023-09-26 ENCOUNTER — Ambulatory Visit: Payer: Self-pay | Admitting: Cardiology

## 2023-10-21 NOTE — Progress Notes (Signed)
 Eric Diaz, male    DOB: 11/21/1945      MRN: [id number]   Brief patient profile:  55  yowm MM/ active smoker onset sob around 2018 on prn saba then trelegy started around 2019 and steroid dep at 10 mg /day with 3 flares since May 2022 while in [place] referred to pulmonary clinic in [place]  09/10/2020 by EDP     History of Present Illness  09/10/2020  Pulmonary/ 1st office eval/ [facility] / [facility]  Chief Complaint  Patient presents with   Pulmonary Consult    Referred by [facility]- pt with COPD and frequent flare ups since June 2022.  He states wheezing some today.   Dyspnea:  worse just today finished pred 4 days prior to OV   Cough: better slt yellowish  Sleep: better p last ER / flat bed with pillow SABA use: neb 4 h prior to OV  02 none  Rec I recommend you get off the coreg   and the substitute is called bisoprolol  5 mg one daily  Plan A = Automatic = Always=    Trelegy 100 one click  first thing each am  Prednisone  10 mg daily - take 2 daily if not doing great  Pantoprazole  (protonix ) 40 mg   Take  30-60 min before first meal of the day and Pepcid  (famotidine )  20 mg after supper until return to office - this is the best way to tell whether stomach acid is contributing to your problem.   Plan B = Backup (to supplement plan A, not to replace it) Only use your albuterol  inhaler as a rescue medication Plan C = Crisis (instead of Plan B but only if Plan B stops working) - only use your albuterol  nebulizer if you first try Plan B and it fails to help > ok to use the nebulizer up to every 4 hours but if start needing it regularly call for immediate appointment GERD diet reviewed, bed blocks rec  Please schedule a follow up office visit in 4 weeks, sooner if needed  with all medications /inhalers/ solutions in hand so we can verify exactly what you are taking. This includes all medications from all doctors and over the counters     Allergy eval > neg skin testing 11/24/20      12/19/2022  f/u ov/[place] [facility]/Darvell [place] re: GOLD 3 copd  maint on trelegy and prednisone  10 mg  - breathing much worse of 5 mg Chief Complaint  Patient presents with   COPD    Gold  Dyspnea:  shop and back about the same  Cough: first thing in am clear Sleeping: flat bed one pillow s resp cc  SABA use: once a day / rare - just with aecopd 02: none  Lung cancer screening: ordered today  Rec  For cough/ congestion > mucinex  or mucinex  dm  up to maximum of  1200 mg every 12 hours as needed  Start Ohtuvayre  nebulizers twice daily  Prednisone  should be able to taper to 10 mg - 5 mg - 10 mg      06/07/2023  f/u ov/[place] office/[facility]te [place] re: GOLD 3 COPD  maint on trelegy / pred 5 mg daily  here for clearance ax bifem  for claudication / wife doing Microbiologist and is skeptical he will survive/ benefit Chief Complaint  Patient presents with   Surgical Clearance   Dyspnea:  limited by feet hurting and some calf pain/ shop is several hundred feet slow and stops  half way but still doing it. Cough: minimal but mostly in am mucoid  Sleeping: on couch to elevate feet on arm s  resp cc  SABA use: saba hfa couple times a week 02: none  Rec You really need to quit smoking at least 2 weeks preop  You are cleared for surgery. Please Re- schedule a follow up visit  for 6 months but call sooner if needed     10/23/2023  f/u ov/[place] office/Demetric [place]naway re: GOLD 3 COPD maint on trelegy 100 / pred 5 mg   still smoking  Chief Complaint  Patient presents with   COPD    12m f/u     Dyspnea:  limited by bilateral claudication/ still able to get to shop and back s stopping  albeit @ slow  pace Cough: mostly in am/ mucoid x 10-15 min Sleeping: flat bed one pillow s noct    resp cc  SABA use: 0-2 per day, never neb 02: none  Much better when on prednisone    Lung cancer screening: referred for LCS    No obvious day to day or daytime variability or assoc   purulent sputum or mucus plugs or  hemoptysis or cp or chest tightness, subjective wheeze or overt sinus or hb symptoms.    Also denies any obvious fluctuation of symptoms with weather or environmental changes or other aggravating or alleviating factors except as outlined above   No unusual exposure hx or h/o childhood pna/ asthma or knowledge of premature birth.  Current Allergies, Complete Past Medical History, Past Surgical History, Family History, and Social History were reviewed in [name].  ROS  The following are not active complaints unless bolded Hoarseness, sore throat, dysphagia, dental problems, itching, sneezing,  nasal congestion or discharge of excess mucus or purulent secretions, ear ache,   fever, chills, sweats, unintended wt loss or wt gain, classically pleuritic or exertional cp,  orthopnea pnd or arm/hand swelling  or leg swelling, presyncope, palpitations, abdominal pain, anorexia, nausea, vomiting, diarrhea  or change in bowel habits or change in bladder habits, change in stools or change in urine, dysuria, hematuria,  rash, arthralgias, visual complaints, headache, numbness, weakness or ataxia or problems with walking or coordination,  change in mood or  memory.        Current Meds  Medication Sig   acetaminophen  (TYLENOL ) 500 MG tablet Take 1,000 mg by mouth 2 (two) times daily.   ACETAZOLAMIDE PO Take 500 mg by mouth every other day.   albuterol  (VENTOLIN  HFA) 108 (90 Base) MCG/ACT inhaler Inhale 1 puff into the lungs every 6 (six) hours as needed for wheezing or shortness of breath. Plz see PCP for additional refills.   Albuterol  Sulfate 2.5 MG/0.5ML NEBU Inhale 0.5 mLs (2.5 mg total) into the lungs 2 (two) times daily as needed (shortness of breath).   Ascorbic Acid (VITAMIN C) 1000 MG tablet Take 500 mg by mouth daily.   aspirin  EC 81 MG tablet Take 1 tablet (81 mg total) by mouth daily with breakfast.   atorvastatin  (LIPITOR) 40 MG tablet Take 40 mg by mouth at  bedtime.   bisoprolol  (ZEBETA ) 5 MG tablet TAKE 1 TABLET (5 MG TOTAL) BY MOUTH DAILY.   Cholecalciferol (VITAMIN D3) 25 MCG (1000 UT) CAPS Take by mouth.   Fluticasone -Umeclidin-Vilant (TRELEGY ELLIPTA ) 100-62.5-25 MCG/ACT AEPB One click each am   gabapentin (NEURONTIN) 300 MG capsule Take 300 mg by mouth 2 (two) times daily.   Multiple Vitamin (MULTIVITAMIN  WITH MINERALS) TABS tablet Take 1 tablet by mouth every evening.   pantoprazole  (PROTONIX ) 40 MG tablet TAKE 1 TABLET (40 MG TOTAL) BY MOUTH DAILY. TAKE 30-60 MIN BEFORE FIRST MEAL OF THE DAY   predniSONE  (DELTASONE ) 5 MG tablet TAKE 1 TABLET BY MOUTH EVERY DAY WITH BREAKFAST   traMADol (ULTRAM) 50 MG tablet Take 50 mg by mouth every 6 (six) hours as needed.   zinc gluconate 50 MG tablet Take 50 mg by mouth every evening.           Past Medical History:  Diagnosis Date   COPD (chronic obstructive pulmonary disease) (HCC)    High cholesterol    Hypertension     Objective:    Wts  10/23/2023         172 06/07/2023        170  01/29/2023        176  12/19/2022      175   09/29/2021         186  02/07/2021     180  10/06/20 194 lb (88 kg)  09/10/20 187 lb (84.8 kg)  08/22/20 192 lb (87.1 kg)    Vital signs reviewed  10/23/2023  - Note at rest 02 sats  100% on RA    General appearance:    amb wm nad  no edema  HEENT :  Oropharynx  clear   Nasal turbinates nl    NECK :  without JVD/Nodes/TM/ nl carotid upstrokes bilaterally   LUNGS: no acc muscle use,  Mod barrel  contour chest wall with bilateral  Distant bs s audible wheeze and  without cough on insp or exp maneuvers and mod  Hyperresonant  to  percussion bilaterally     CV:  RRR  no s3 or murmur or increase in P2, and no edema   ABD:  soft and nontender with pos mid insp Hoover's  in the supine position. No bruits or organomegaly appreciated, bowel sounds nl  MS:   Ext warm without deformities or   obvious joint restrictions , calf tenderness, cyanosis or  clubbing  SKIN: warm and dry without lesions    NEURO:  alert, approp, nl sensorium with  no motor or cerebellar deficits apparent.         Assessment   Assessment & Plan COPD GOLD 3 Active smoker / pred dependent per PCP since around 2020 - PFT's  11/17/19 FEV1 1.15 (39 % ) ratio 0.45  p 2  % improvement from saba p ? prior to study with DLCO  8.7 (43%) corrects to 2.11 (88%)  for alv volume and FV curve classic concavity   - Labs ordered 09/10/2020  :     alpha one AT phenotype  MM   Level 150  - Allergy profile  09/10/20  >  Eos 0.0 /  IgE  1348 > refer to allergy  09/15/2020  rec antihistamines and f/u 3 m  >>> neg skin testing 11/24/20 - 09/29/2021   Walked on RA  x  2  lap(s) =  approx 300  ft  @ mod/cane pace, stopped due to legs tired  with lowest 02 sats 91%  - 12/19/2022 added Ohtuvayre  > could not afford it.  - LDSCT  01/05/23 moderate centrilobular and paraseptal emphysema.  - add prednisone  5 mg floor with PLAN D = 4 daily until better then taper back to 5 mg daily    Group D (now reclassified as E) in terms  of symptom/risk and laba/lama/ICS  therefore appropriate rx at this point >>>  trelegy plus approp saba prn prednisone  as above as can't afford Ohtuvayre  on his present plan   F/u q  6 m, sooner prn  Each resp medication was reviewed in detail including emphasizing most importantly the difference between maintenance and prns and under what circumstances the prns are to be triggered using an action plan format where appropriate.  Total time for H and P, chart review, counseling, reviewing DPI/ hfa/ /neb  device(s) and generating customized AVS unique to this office visit / same day charting = 6 m         Cigarette smoker Referred for annual lung cancer screeing due q September 09/29/2021   - - not able to cooperate for LDSCT  01/15/23 but leave in program for now   4-5 min discussion re active cigarette smoking in addition to office E&M  Ask about tobacco use:   ongoing   Advise quitting   I took an extended  opportunity with this patient to outline the consequences of continued cigarette use  in airway disorders based on all the data we have from the multiple national lung health studies (perfomed over decades at millions of dollars in cost)  indicating that smoking cessation, not choice of inhalers or pulmonary physicians, is the most important aspect of his care.   Assess willingness:  Not committed at this point Assist in quit attempt:  Per PCP when ready Arrange follow up:   Follow up per Primary Care planned          AVS  Patient Instructions  The key is to stop smoking completely before smoking completely stops you!  Plan A = Automatic = Always=    Trelegy 100 one click each  AM  Plan B = Backup (to supplement plan A, not to replace it) Use your albuterol  inhaler as a rescue medication to be used if you can't catch your breath by resting or slowing your pace  or doing a relaxed purse lip breathing pattern.  - The less you use it, the better it will work when you need it. - Ok to use the inhaler up to 2 puffs  every 4 hours if you must but call for appointment if use goes up over your usual need - Don't leave home without it !!  (think of it like the spare tire or starter fluid for your car)   Plan C = Crisis (instead of Plan B but only if Plan B stops working) - only use your albuterol  nebulizer if you first try Plan B and it fails to help > ok to use the nebulizer up to every 4 hours but if start needing it regularly call for immediate appointment   Plan D = prednisone   5 mg  - if ABC not working increase to 4 pills each am until better then 2 each am x 5 days and then back to one daily   Please schedule a follow up visit in 6  months but call sooner if needed    Ozell America, MD 10/28/2023

## 2023-10-23 ENCOUNTER — Ambulatory Visit: Admitting: Internal Medicine

## 2023-10-23 ENCOUNTER — Encounter: Payer: Self-pay | Admitting: Internal Medicine

## 2023-10-23 VITALS — BP 147/70 | HR 71 | Ht 71.0 in | Wt 172.6 lb

## 2023-10-23 DIAGNOSIS — J449 Chronic obstructive pulmonary disease, unspecified: Secondary | ICD-10-CM | POA: Diagnosis not present

## 2023-10-23 DIAGNOSIS — F17210 Nicotine dependence, cigarettes, uncomplicated: Secondary | ICD-10-CM

## 2023-10-23 MED ORDER — TRELEGY ELLIPTA 100-62.5-25 MCG/ACT IN AEPB: INHALATION_SPRAY | RESPIRATORY_TRACT | 3 refills | Status: AC

## 2023-10-23 MED ORDER — TRELEGY ELLIPTA 100-62.5-25 MCG/ACT IN AEPB: 1.0000 | INHALATION_SPRAY | Freq: Every day | RESPIRATORY_TRACT | Status: AC

## 2023-10-23 NOTE — Patient Instructions (Addendum)
 The key is to stop smoking completely before smoking completely stops you!  Plan A = Automatic = Always=    Trelegy 100 one click each  AM  Plan B = Backup (to supplement plan A, not to replace it) Use your albuterol  inhaler as a rescue medication to be used if you can't catch your breath by resting or slowing your pace  or doing a relaxed purse lip breathing pattern.  - The less you use it, the better it will work when you need it. - Ok to use the inhaler up to 2 puffs  every 4 hours if you must but call for appointment if use goes up over your usual need - Don't leave home without it !!  (think of it like the spare tire or starter fluid for your car)   Plan C = Crisis (instead of Plan B but only if Plan B stops working) - only use your albuterol  nebulizer if you first try Plan B and it fails to help > ok to use the nebulizer up to every 4 hours but if start needing it regularly call for immediate appointment   Plan D = prednisone   5 mg  - if ABC not working increase to 4 pills each am until better then 2 each am x 5 days and then back to one daily   Please schedule a follow up visit in 6  months but call sooner if needed

## 2023-10-28 ENCOUNTER — Encounter: Payer: Self-pay | Admitting: Internal Medicine

## 2023-10-28 NOTE — Assessment & Plan Note (Addendum)
 Referred for annual lung cancer screeing due q September 09/29/2021   - - not able to cooperate for LDSCT  01/15/23 but leave in program for now   4-5 min discussion re active cigarette smoking in addition to office E&M  Ask about tobacco use:   ongoing  Advise quitting   I took an extended  opportunity with this patient to outline the consequences of continued cigarette use  in airway disorders based on all the data we have from the multiple national lung health studies (perfomed over decades at millions of dollars in cost)  indicating that smoking cessation, not choice of inhalers or pulmonary physicians, is the most important aspect of his care.   Assess willingness:  Not committed at this point Assist in quit attempt:  Per PCP when ready Arrange follow up:   Follow up per Primary Care planned

## 2023-10-28 NOTE — Assessment & Plan Note (Addendum)
 Active smoker / pred dependent per PCP since around 2020 - PFT's  11/17/19 FEV1 1.15 (39 % ) ratio 0.45  p 2  % improvement from saba p ? prior to study with DLCO  8.7 (43%) corrects to 2.11 (88%)  for alv volume and FV curve classic concavity   - Labs ordered 09/10/2020  :     alpha one AT phenotype  MM   Level 150  - Allergy profile  09/10/20  >  Eos 0.0 /  IgE  1348 > refer to allergy  09/15/2020  rec antihistamines and f/u 3 m  >>> neg skin testing 11/24/20 - 09/29/2021   Walked on RA  x  2  lap(s) =  approx 300  ft  @ mod/cane pace, stopped due to legs tired  with lowest 02 sats 91%  - 12/19/2022 added Ohtuvayre  > could not afford it.  - LDSCT  01/05/23 moderate centrilobular and paraseptal emphysema.  - add prednisone  5 mg floor with PLAN D = 4 daily until better then taper back to 5 mg daily    Group D (now reclassified as E) in terms of symptom/risk and laba/lama/ICS  therefore appropriate rx at this point >>>  trelegy plus approp saba prn prednisone  as above as can't afford Ohtuvayre  on his present plan   F/u q  6 m, sooner prn  Each resp medication was reviewed in detail including emphasizing most importantly the difference between maintenance and prns and under what circumstances the prns are to be triggered using an action plan format where appropriate.  Total time for H and P, chart review, counseling, reviewing DPI/ hfa/ /neb  device(s) and generating customized AVS unique to this office visit / same day charting = 6 m

## 2023-11-29 NOTE — Progress Notes (Signed)
 Remote PPM Transmission

## 2023-12-24 ENCOUNTER — Ambulatory Visit: Payer: Medicare HMO

## 2023-12-31 ENCOUNTER — Ambulatory Visit

## 2023-12-31 DIAGNOSIS — I442 Atrioventricular block, complete: Secondary | ICD-10-CM | POA: Diagnosis not present

## 2024-01-01 ENCOUNTER — Ambulatory Visit: Payer: Self-pay | Admitting: Cardiology

## 2024-01-01 LAB — CUP PACEART REMOTE DEVICE CHECK
Battery Remaining Longevity: 56 mo
Battery Remaining Percentage: 44 %
Battery Voltage: 2.99 V
Brady Statistic AP VP Percent: 3.4 %
Brady Statistic AP VS Percent: 1 %
Brady Statistic AS VP Percent: 93 %
Brady Statistic AS VS Percent: 1.8 %
Brady Statistic RA Percent Paced: 1.5 %
Brady Statistic RV Percent Paced: 96 %
Date Time Interrogation Session: 20251102233851
Implantable Lead Connection Status: 753985
Implantable Lead Connection Status: 753985
Implantable Lead Implant Date: 20200108
Implantable Lead Implant Date: 20200108
Implantable Lead Location: 753859
Implantable Lead Location: 753860
Implantable Lead Model: 5076
Implantable Lead Model: 5076
Implantable Pulse Generator Implant Date: 20200108
Lead Channel Impedance Value: 390 Ohm
Lead Channel Impedance Value: 930 Ohm
Lead Channel Pacing Threshold Amplitude: 0.5 V
Lead Channel Pacing Threshold Amplitude: 0.75 V
Lead Channel Pacing Threshold Pulse Width: 0.4 ms
Lead Channel Pacing Threshold Pulse Width: 0.5 ms
Lead Channel Sensing Intrinsic Amplitude: 3.7 mV
Lead Channel Sensing Intrinsic Amplitude: 5.5 mV
Lead Channel Setting Pacing Amplitude: 0.75 V
Lead Channel Setting Pacing Amplitude: 2 V
Lead Channel Setting Pacing Pulse Width: 0.4 ms
Lead Channel Setting Sensing Sensitivity: 0.5 mV
Pulse Gen Model: 2272
Pulse Gen Serial Number: 9091999

## 2024-01-02 NOTE — Progress Notes (Signed)
 Remote PPM Transmission

## 2024-03-05 ENCOUNTER — Encounter: Payer: Self-pay | Admitting: Internal Medicine

## 2024-03-24 ENCOUNTER — Ambulatory Visit: Payer: Medicare HMO

## 2024-03-31 ENCOUNTER — Ambulatory Visit

## 2024-04-01 LAB — CUP PACEART REMOTE DEVICE CHECK
Battery Remaining Longevity: 53 mo
Battery Remaining Percentage: 41 %
Battery Voltage: 2.99 V
Brady Statistic AP VP Percent: 5.1 %
Brady Statistic AP VS Percent: 1 %
Brady Statistic AS VP Percent: 91 %
Brady Statistic AS VS Percent: 1.8 %
Brady Statistic RA Percent Paced: 2.5 %
Brady Statistic RV Percent Paced: 96 %
Date Time Interrogation Session: 20260202021647
Implantable Lead Connection Status: 753985
Implantable Lead Connection Status: 753985
Implantable Lead Implant Date: 20200108
Implantable Lead Implant Date: 20200108
Implantable Lead Location: 753859
Implantable Lead Location: 753860
Implantable Lead Model: 5076
Implantable Lead Model: 5076
Implantable Pulse Generator Implant Date: 20200108
Lead Channel Impedance Value: 380 Ohm
Lead Channel Impedance Value: 900 Ohm
Lead Channel Pacing Threshold Amplitude: 0.5 V
Lead Channel Pacing Threshold Amplitude: 0.75 V
Lead Channel Pacing Threshold Pulse Width: 0.4 ms
Lead Channel Pacing Threshold Pulse Width: 0.5 ms
Lead Channel Sensing Intrinsic Amplitude: 12 mV
Lead Channel Sensing Intrinsic Amplitude: 4 mV
Lead Channel Setting Pacing Amplitude: 0.75 V
Lead Channel Setting Pacing Amplitude: 2 V
Lead Channel Setting Pacing Pulse Width: 0.4 ms
Lead Channel Setting Sensing Sensitivity: 0.5 mV
Pulse Gen Model: 2272
Pulse Gen Serial Number: 9091999

## 2024-06-23 ENCOUNTER — Ambulatory Visit: Payer: Medicare HMO

## 2024-06-30 ENCOUNTER — Ambulatory Visit

## 2024-09-22 ENCOUNTER — Ambulatory Visit: Payer: Medicare HMO
